# Patient Record
Sex: Male | Born: 1950 | Race: White | Hispanic: No | Marital: Married | Smoking: Former smoker
Health system: Southern US, Community
[De-identification: ages and names within clinical notes are randomized; demographics above are authoritative.]

## PROBLEM LIST (undated history)

## (undated) DIAGNOSIS — I10 Essential (primary) hypertension: Secondary | ICD-10-CM

## (undated) DIAGNOSIS — R7303 Prediabetes: Secondary | ICD-10-CM

## (undated) DIAGNOSIS — E039 Hypothyroidism, unspecified: Secondary | ICD-10-CM

## (undated) DIAGNOSIS — Z86010 Personal history of colonic polyps: Secondary | ICD-10-CM

## (undated) DIAGNOSIS — G8929 Other chronic pain: Secondary | ICD-10-CM

## (undated) DIAGNOSIS — K648 Other hemorrhoids: Secondary | ICD-10-CM

## (undated) DIAGNOSIS — R011 Cardiac murmur, unspecified: Secondary | ICD-10-CM

## (undated) DIAGNOSIS — Z87442 Personal history of urinary calculi: Secondary | ICD-10-CM

## (undated) DIAGNOSIS — D099 Carcinoma in situ, unspecified: Secondary | ICD-10-CM

## (undated) DIAGNOSIS — M549 Dorsalgia, unspecified: Secondary | ICD-10-CM

## (undated) DIAGNOSIS — C4492 Squamous cell carcinoma of skin, unspecified: Secondary | ICD-10-CM

## (undated) DIAGNOSIS — J302 Other seasonal allergic rhinitis: Secondary | ICD-10-CM

## (undated) DIAGNOSIS — N529 Male erectile dysfunction, unspecified: Secondary | ICD-10-CM

## (undated) DIAGNOSIS — M1990 Unspecified osteoarthritis, unspecified site: Secondary | ICD-10-CM

## (undated) HISTORY — PX: NASAL SEPTUM SURGERY: SHX37

## (undated) HISTORY — DX: Unspecified osteoarthritis, unspecified site: M19.90

## (undated) HISTORY — PX: KNEE ARTHROPLASTY: SHX992

## (undated) HISTORY — DX: Other hemorrhoids: K64.8

## (undated) HISTORY — DX: Other seasonal allergic rhinitis: J30.2

## (undated) HISTORY — PX: HERNIA REPAIR: SHX51

## (undated) HISTORY — DX: Personal history of colonic polyps: Z86.010

## (undated) HISTORY — PX: OTHER SURGICAL HISTORY: SHX169

## (undated) HISTORY — PX: KNEE ARTHROSCOPY: SUR90

## (undated) HISTORY — DX: Hypothyroidism, unspecified: E03.9

## (undated) HISTORY — DX: Male erectile dysfunction, unspecified: N52.9

## (undated) HISTORY — PX: CARPAL TUNNEL RELEASE: SHX101

---
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
## 2003-12-04 ENCOUNTER — Encounter (INDEPENDENT_AMBULATORY_CARE_PROVIDER_SITE_OTHER): Payer: Self-pay | Admitting: *Deleted

## 2003-12-04 ENCOUNTER — Ambulatory Visit (HOSPITAL_COMMUNITY): Admission: RE | Admit: 2003-12-04 | Discharge: 2003-12-04 | Payer: Self-pay | Admitting: Gastroenterology

## 2004-04-02 DIAGNOSIS — C4492 Squamous cell carcinoma of skin, unspecified: Secondary | ICD-10-CM

## 2004-04-02 HISTORY — DX: Squamous cell carcinoma of skin, unspecified: C44.92

## 2008-02-23 ENCOUNTER — Ambulatory Visit (HOSPITAL_BASED_OUTPATIENT_CLINIC_OR_DEPARTMENT_OTHER): Admission: RE | Admit: 2008-02-23 | Discharge: 2008-02-23 | Payer: Self-pay | Admitting: Orthopedic Surgery

## 2008-03-21 ENCOUNTER — Ambulatory Visit (HOSPITAL_BASED_OUTPATIENT_CLINIC_OR_DEPARTMENT_OTHER): Admission: RE | Admit: 2008-03-21 | Discharge: 2008-03-21 | Payer: Self-pay | Admitting: Orthopedic Surgery

## 2008-10-25 ENCOUNTER — Inpatient Hospital Stay (HOSPITAL_COMMUNITY): Admission: RE | Admit: 2008-10-25 | Discharge: 2008-10-28 | Payer: Self-pay | Admitting: Specialist

## 2008-11-13 ENCOUNTER — Encounter: Admission: RE | Admit: 2008-11-13 | Discharge: 2009-01-16 | Payer: Self-pay | Admitting: Specialist

## 2010-01-02 ENCOUNTER — Inpatient Hospital Stay (HOSPITAL_COMMUNITY): Admission: RE | Admit: 2010-01-02 | Discharge: 2010-01-05 | Payer: Self-pay | Admitting: Specialist

## 2010-01-21 ENCOUNTER — Encounter
Admission: RE | Admit: 2010-01-21 | Discharge: 2010-04-16 | Payer: Self-pay | Source: Home / Self Care | Attending: Specialist | Admitting: Specialist

## 2010-07-02 LAB — BASIC METABOLIC PANEL
BUN: 13 mg/dL (ref 6–23)
BUN: 7 mg/dL (ref 6–23)
CO2: 26 mEq/L (ref 19–32)
CO2: 27 mEq/L (ref 19–32)
CO2: 28 mEq/L (ref 19–32)
Calcium: 8.4 mg/dL (ref 8.4–10.5)
Calcium: 8.7 mg/dL (ref 8.4–10.5)
Creatinine, Ser: 0.86 mg/dL (ref 0.4–1.5)
Creatinine, Ser: 1.01 mg/dL (ref 0.4–1.5)
GFR calc Af Amer: >60 mL/min (ref >60)
GFR calc non Af Amer: >60 mL/min (ref >60)
GFR calc non Af Amer: >60 mL/min (ref >60)
GFR calc non Af Amer: >60 mL/min (ref >60)
Glucose, Bld: 147 mg/dL — ABNORMAL HIGH (ref 70–99)
Glucose, Bld: 172 mg/dL — ABNORMAL HIGH (ref 70–99)
Potassium: 4.4 mEq/L (ref 3.5–5.1)
Sodium: 136 mEq/L (ref 135–145)
Sodium: 137 mEq/L (ref 135–145)

## 2010-07-02 LAB — DIFFERENTIAL
Eosinophils Absolute: 0.2 10*3/uL (ref 0.0–0.7)
Eosinophils Relative: 3 % (ref 0–5)
Lymphs Abs: 2.4 10*3/uL (ref 0.7–4.0)
Monocytes Absolute: 0.7 10*3/uL (ref 0.1–1.0)
Monocytes Relative: 12 % (ref 3–12)

## 2010-07-02 LAB — CBC
HCT: 36.8 % — ABNORMAL LOW (ref 39.0–52.0)
Hemoglobin: 12.7 g/dL — ABNORMAL LOW (ref 13.0–17.0)
Hemoglobin: 14.2 g/dL (ref 13.0–17.0)
MCH: 29.9 pg (ref 26.0–34.0)
MCH: 30 pg (ref 26.0–34.0)
MCH: 30.2 pg (ref 26.0–34.0)
MCHC: 34.5 g/dL (ref 30.0–36.0)
MCHC: 34.7 g/dL (ref 30.0–36.0)
MCHC: 34.2 g/dL (ref 30.0–36.0)
Platelets: 192 10*3/uL (ref 150–400)
Platelets: 202 10*3/uL (ref 150–400)
RBC: 3.68 MIL/uL — ABNORMAL LOW (ref 4.22–5.81)
RDW: 13.8 % (ref 11.5–15.5)
RDW: 14 % (ref 11.5–15.5)
RDW: 14 % (ref 11.5–15.5)
WBC: 13.8 10*3/uL — ABNORMAL HIGH (ref 4.0–10.5)
WBC: 6.4 10*3/uL (ref 4.0–10.5)

## 2010-07-02 LAB — COMPREHENSIVE METABOLIC PANEL
ALT: 28 U/L (ref 0–53)
AST: 26 U/L (ref 0–37)
CO2: 27 mEq/L (ref 19–32)
Calcium: 9.2 mg/dL (ref 8.4–10.5)
GFR calc Af Amer: >60 mL/min (ref >60)
Potassium: 4.1 mEq/L (ref 3.5–5.1)
Sodium: 140 mEq/L (ref 135–145)
Total Protein: 7.2 g/dL (ref 6.0–8.3)

## 2010-07-02 LAB — URINALYSIS, ROUTINE W REFLEX MICROSCOPIC
Ketones, ur: NEGATIVE mg/dL
Nitrite: NEGATIVE
pH: 5.5 (ref 5.0–8.0)

## 2010-07-02 LAB — MRSA CULTURE

## 2010-07-02 LAB — CROSSMATCH
ABO/RH(D): O POS
Antibody Screen: NEGATIVE

## 2010-07-02 LAB — SURGICAL PCR SCREEN: Staphylococcus aureus: INVALID — AB

## 2010-07-26 LAB — CBC
HCT: 35.8 % — ABNORMAL LOW (ref 39.0–52.0)
HCT: 37.8 % — ABNORMAL LOW (ref 39.0–52.0)
Hemoglobin: 11.9 g/dL — ABNORMAL LOW (ref 13.0–17.0)
Hemoglobin: 12.6 g/dL — ABNORMAL LOW (ref 13.0–17.0)
Hemoglobin: 15.1 g/dL (ref 13.0–17.0)
MCHC: 33.1 g/dL (ref 30.0–36.0)
MCHC: 33.4 g/dL (ref 30.0–36.0)
MCHC: 33.5 g/dL (ref 30.0–36.0)
MCHC: 33.6 g/dL (ref 30.0–36.0)
MCV: 87.1 fL (ref 78.0–100.0)
MCV: 87.9 fL (ref 78.0–100.0)
Platelets: 207 K/uL (ref 150–400)
RBC: 4.08 MIL/uL — ABNORMAL LOW (ref 4.22–5.81)
RBC: 4.34 MIL/uL (ref 4.22–5.81)
RDW: 13.6 % (ref 11.5–15.5)
RDW: 13.8 % (ref 11.5–15.5)
RDW: 13.8 % (ref 11.5–15.5)
RDW: 14.1 % (ref 11.5–15.5)
WBC: 13.1 K/uL — ABNORMAL HIGH (ref 4.0–10.5)

## 2010-07-26 LAB — BASIC METABOLIC PANEL
CO2: 29 mEq/L (ref 19–32)
Chloride: 101 mEq/L (ref 96–112)
Glucose, Bld: 132 mg/dL — ABNORMAL HIGH (ref 70–99)
Potassium: 4.2 mEq/L (ref 3.5–5.1)
Sodium: 135 mEq/L (ref 135–145)

## 2010-07-26 LAB — URINALYSIS, ROUTINE W REFLEX MICROSCOPIC
Bilirubin Urine: NEGATIVE
Ketones, ur: NEGATIVE mg/dL
Nitrite: NEGATIVE
Urobilinogen, UA: 0.2 mg/dL (ref 0.0–1.0)
pH: 6.5 (ref 5.0–8.0)

## 2010-07-26 LAB — CROSSMATCH
ABO/RH(D): O POS
Antibody Screen: NEGATIVE

## 2010-07-26 LAB — COMPREHENSIVE METABOLIC PANEL
ALT: 25 U/L (ref 0–53)
Calcium: 9.1 mg/dL (ref 8.4–10.5)
Glucose, Bld: 120 mg/dL — ABNORMAL HIGH (ref 70–99)
Sodium: 135 mEq/L (ref 135–145)
Total Protein: 7.3 g/dL (ref 6.0–8.3)

## 2010-07-26 LAB — DIFFERENTIAL
Eosinophils Absolute: 0.1 10*3/uL (ref 0.0–0.7)
Lymphocytes Relative: 36 % (ref 12–46)
Lymphs Abs: 2.3 10*3/uL (ref 0.7–4.0)
Monocytes Relative: 12 % (ref 3–12)
Neutro Abs: 3.2 10*3/uL (ref 1.7–7.7)
Neutrophils Relative %: 50 % (ref 43–77)

## 2010-07-26 LAB — APTT: aPTT: 39 seconds — ABNORMAL HIGH (ref 24–37)

## 2010-07-26 LAB — BASIC METABOLIC PANEL WITH GFR
BUN: 12 mg/dL (ref 6–23)
CO2: 27 meq/L (ref 19–32)
Calcium: 8.3 mg/dL — ABNORMAL LOW (ref 8.4–10.5)
Chloride: 103 meq/L (ref 96–112)
Creatinine, Ser: 0.96 mg/dL (ref 0.4–1.5)
GFR calc non Af Amer: >60 mL/min
Glucose, Bld: 147 mg/dL — ABNORMAL HIGH (ref 70–99)
Potassium: 4.2 meq/L (ref 3.5–5.1)
Sodium: 137 meq/L (ref 135–145)

## 2010-07-26 LAB — PROTIME-INR: INR: 1 (ref 0.00–1.49)

## 2010-09-01 NOTE — Discharge Summary (Signed)
[name], [name] NO.:  [id number]   MEDICAL RECORD NO.:  [id number]          PATIENT TYPE:  INP   LOCATION:  1618                         FACILITY:  [facility]   PHYSICIAN:  [name] [name], M.D.DATE OF BIRTH:  1950/05/15   DATE OF ADMISSION:  10/25/2008  DATE OF DISCHARGE:  10/28/2008                               DISCHARGE SUMMARY   DISPOSITION:  To home.   ADMISSION DIAGNOSES:  1. 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 Thyroid disease.  3. Seasonal allergies.  4. Erectile dysfunction.   DISCHARGE DIAGNOSES:  1. Right total knee arthroplasty.  2. Routine postoperative blood loss,  asymptomatic.  3. History of thyroid disease.  4. History of seasonal allergies.  5. History of erectile dysfunction.   HISTORY OF PRESENT ILLNESS:  The patient is a 60 year old gentleman with  bilateral knee pains.  Evaluation showed significant end-stage arthritic  changes, bone on bone medial compartments.  The patient has failed  conservative treatment.  The patient has elected to proceed with total  knee arthroplasty.   ALLERGIES:  No known drug allergies.   MEDICATIONS ON ADMISSION:  1. Fexofenadine.  2. Aspirin.  3. Vitamin B complex.  4. Vitamin D.  5. Osteo-BiFlex.  6. Cialis p.r.n.   SURGICAL PROCEDURE:  On October 25, 2008, the patient was taken to the  operating room by Dr. Valma [name], assisted by [name] Alar, PA-C under  spinal anesthesia, the patient underwent a right total knee arthroplasty  with a rotating platform system.  The patient tolerated the procedure  well.  There were no complications.  The patient was transferred to the  recovery room and then to the orthopedic floor in good condition.  The  patient had the following components implanted:  A sized 4 right femoral  component, a size 5 keel tibial tray, a size 4, 15 mm thickness  polyethylene bearing, a size  38, 3 peg patella.  All components were  implanted with polymethylmethacrylate.   CONSULTATIONS:  The following routine consultations were requested:  1. Physical therapy.  2. Case management.  3. Pharmacy.   HOSPITAL COURSE:  On October 25, 2008 the patient was admitted to [facility] under the care of Dr. Valma [name].  The patient was  taken to the operating room where a right total knee arthroplasty was  performed without any complications.  The patient tolerated the  procedure well without any issues.  The patient was transferred to the  recovery room and then to the orthopedic floor to follow with total knee  protocol on Lovenox for deep venous thrombosis prophylaxis and  intravenous antibiotics.  The patient then incurred a 3 day  postoperative course and the patient did very well.  The patient's vital  signs remained stable.  He remained afebrile.  The patient's wound  benign for any signs of infection.  His leg remained neuromotor  vascularly intact.  The patient worked well with physical therapy.  On  the date of discharge he  was able to get up to 60 degrees with the CPM.  The patient was ambulating 200 to 250 feet daily with physical therapy.  The patient did develop some routine postoperative blood loss for which  he was able to tolerate it well without any problems with orthostatic  hypotension, light headedness, dizziness or any vital sign changes.  His  hemoglobin did drop to 10.8.  Otherwise, the patient did very well.  He  was medically and orthopedically stable and ready for discharge to home  with home health physical therapy and routine postoperative followup  with Lovenox DVT prophylaxis.   LABORATORY DATA:  CBC on admission found white blood cells to be 6.5,  hemoglobin 15.1, hematocrit 44.9, platelet count 222,000.  At discharge  from hospital, the patient's white blood cells were 12.5, which is  routine postoperative reaction, hemoglobin was 10.8,  hematocrit 32.7,  platelet count 179,000.  Admission chemistries found sodium of 135,  potassium 4.3, glucose 120, BUN 13, creatinine 1.01.  On postoperative  day sodium was 135, potassium was 4.2, BUN was 7, creatinine 0.87 with a  glucose of 132.   DISCHARGE INSTRUCTIONS:   DIET:  No restrictions.   ACTIVITY:  The patient is to increase his activity on a daily basis  slowly with use of a walker and instructions with physical therapy.  Home health physical therapy to work on ambulation, range of motion and  strengthening.  CPM 0 to 60 degrees for 6 hours a day, may slowly  increase range of motion to 90 degrees a day.   WOUND CARE:  The patient was to keep his wound clean and dry and to  change the dressing daily.   FOLLOWUP:  The patient needs a followup appointment with Dr. Thomasena [name] in  2 weeks from the date of surgery.  The patient was to call [phone] for  that followup appointment.   DISCHARGE MEDICATIONS:  1. Percocet 5 mg, one or two tablets every 4 to 6 hours for pain if      needed,.  2. Robaxin 500 mg one tablet every 6 hours for muscle spasms if      needed.  3. Lovenox 30 mg injection once every 12 hours until gone, for a total      of 3 weeks.  4. Iron 1 tablet a day until gone.  5. Colace 100 mg one tablet twice a day while on narcotics, may get      OTC.  6. MiraLax 17 grams for constipation, if needed, may get OTC.  7. Levothyroxine 0.05 mg once a day.  8. Fexofenadine 180 mg a day.  9. Vitamin D one tablet a day.  10.Osteo-BiFlex 2 tablets a day.  11.Aspirin 81 mg a day.  12.Vitamin B12 one tablet a day.   CONDITION ON DISCHARGE:  The patient's condition on discharge to home is  listed as improved and good.      [name] [name], P.A.    ______________________________  [name] [name], M.D.    RWK/MEDQ  D:  10/28/2008  T:  10/28/2008  Job:  [id number]   cc:   [name] [name], M.D.  Fax: [phone]

## 2010-09-01 NOTE — Op Note (Signed)
NAMECLETE, [name] [name]                ACCOUNT NO.:  [id number]   MEDICAL RECORD NO.:  [id number]          PATIENT TYPE:  AMB   LOCATION:  DSC                          FACILITY:  [facility]   PHYSICIAN:  Katy Fitch. Sypher, M.D. DATE OF BIRTH:  12/13/1950   DATE OF PROCEDURE:  03/21/2008  DATE OF DISCHARGE:                               OPERATIVE REPORT   PREOPERATIVE DIAGNOSIS:  Chronic entrapped neuropathy, left median nerve  or carpal tunnel.   POSTOPERATIVE DIAGNOSIS:  Chronic entrapped neuropathy, left median  nerve or carpal tunnel.   OPERATIONS:  Release of left transcarpal ligament.   OPERATING SURGEON:  Katy Fitch. Sypher, MD   ASSISTANT:  Marveen Reeks Dasnoit, PA-C   ANESTHESIA:  General by LMA.   SUPERVISING ANESTHESIOLOGIST:  Guadalupe Maple, MD   INDICATIONS:  [name] [name] is a 60 year old gentleman who referred for  evaluation and management of bilateral hand numbness and a right long  trigger finger.   He is status post release of his right transcarpal ligament and release  of his right long finger A1 pulley of satisfactory results.  He now  presents for release of his left transcarpal ligament.   Preoperatively, he was examined in the holding area to assure that he  did not have other areas of stenosing tenosynovitis or predicaments  beside the left carpal tunnel syndrome.   After informed consent, he was brought to the operating at this time and  anticipating release of his left transverse carpal ligament.   PROCEDURE:  [name] [name] was brought to the operating room and placed in  supine position on the operating table.   Following the induction of general anesthesia by LMA technique, the left  arm was prepped with Betadine soap solution and sterilely draped.  A  pneumatic tourniquet was applied to the proximal left brachium.   Following exsanguination of the left arm with an Esmarch bandage, the  arterial tourniquet was inflated to 220 mmHg.  The procedure  commenced  with a short incision in the line of the ring finger of the palm.  Subcutaneous tissues were carefully divided revealing the palmar fascia.  This was split longitudinally to reveal a common sensory branch of the  median nerve.  These were followed back to transcarpal ligament which  was gently isolated from the median nerve.   The ligament was then released along its ulnar border extending into the  distal forearm.   This widely opened carpal canal.   No masses or other predicaments were noted   Bleeding points along the margin of the released ligament were  electrocauterized with bipolar current followed by repair of the skin  with intradermal 3-0 Prolene suture.  A compressive dressing was applied  with volar plaster splint maintaining the wrist in 5 degrees of  dorsiflexion.   For aftercare, [name] [name] was provided a prescription for Percocet 5 mg  one p.o. q.4-6 h. p.r.n. pain.  He was provided 20 tablets without  refill.      Katy Fitch Sypher, M.D.  Electronically Signed     RVS/MEDQ  D:  03/21/2008  T:  03/21/2008  Job:  [id number]

## 2010-09-01 NOTE — Op Note (Signed)
NAMETYWONE, BEMBENEK                ACCOUNT NO.:  [id number]   MEDICAL RECORD NO.:  [id number]          PATIENT TYPE:  AMB   LOCATION:  DSC                          FACILITY:  [facility]   PHYSICIAN:  Katy Fitch. Sypher, M.D. DATE OF BIRTH:  09-04-1950   DATE OF PROCEDURE:  02/23/2008  DATE OF DISCHARGE:                               OPERATIVE REPORT   PREOPERATIVE DIAGNOSES:  1. Chronic entrapped neuropathy, median nerve right carpal tunnel.  2. Chronic stenosing tenosynovitis, right long finger A1 pulley.  3. Stiff right long finger proximal interphalangeal joint with 20-      degree flexion contracture due to chronic stenosing tenosynovitis.   POSTOPERATIVE DIAGNOSES:  1. Chronic entrapped neuropathy, median nerve right carpal tunnel.  2. Chronic stenosing tenosynovitis, right long finger A1 pulley.  3. Stiff right long finger proximal interphalangeal joint with 20-      degree flexion contracture due to chronic stenosing tenosynovitis.   OPERATION:  1. Release of right transcarpal ligament.  2. Release of right long finger stenosing tenosynovitis by incision of      A1 pulley and tenosynovectomy of superficialis profundus tendons.  3. Injection of right long finger proximal interphalangeal joint      capsule.   OPERATING SURGEON:  Katy Fitch. Sypher, MD   ASSISTANT:  Marveen Reeks Dasnoit, PA   ANESTHESIA:  General by LMA.   SUPERVISING ANESTHESIOLOGIST:  Kaylyn Layer. Michelle Piper, MD   INDICATIONS:  Danta Baumgardner is a 60 year old gentleman referred through  the courtesy of the [employer] for evaluation of hand  numbness and a locking right long trigger finger.  Clinical examination  confirmed right carpal tunnel syndrome with positive electrodiagnostic  studies and chronic locking stenosing tenosynovitis of the right long  finger.  Due to a failed respond to nonoperative measures, he was  brought to the operating at this time for release of his right  transcarpal ligament,  his right long finger A1 pulley, and due to the  stiffness of the PIP joint with a flexion contracture, we recommended  injection of the PIP joint capsule with Depo-Medrol and lidocaine to  facilitate recovery of full motion after A1 pulley release.  After  informed consent, Mr. Bulkley was brought to the operating at this time.   DESCRIPTION OF PROCEDURE:  Aadon Gorelik was brought to the operating  room and placed in the supine position upon the operating table.   Following induction of general anesthesia by LMA technique, the right  arm was prepped with Betadine soap solution and sterilely draped.  A  pneumatic tourniquet was applied to the proximal brachium.  Following  exsanguination of the right arm with an Esmarch bandage, arterial  tourniquet was inflated to 220 mmHg.  The procedure was commenced with a  short incision in the line of the ring finger in the palm.  Subcutaneous  tissues were carefully divided revealing the palmar fascia.  This split  longitudinally to reveal the common sensory branches of the median  nerve.  These were followed back to the transverse carpal ligament,  which was gently isolated to the  median nerve.  A [place]field 4 elevator  was used to clear pathway to the carpal canal, separate the median nerve  from the tenosynovium and transverse carpal ligament.   The ligament was released along its ulnar border extending into the  distal forearm.  This widely opened the carpal canal.  No masses or  other predicaments were noted.  There was fibrotic tenosynovium in the  ulnar bursa.   The right long finger stenosing tenosynovitis was then addressed.  A  short oblique incision was fashioned in the distal palmar crease.  Subcutaneous tissues were carefully divided releasing the palmar fascia  and pre-tendinous fibers.  The A1 pulley was identified and a large  bulging cuff of fibrotic tenosynovium was noted proximal to the A1  pulley.   The fibrotic cuff was  resected with scissors and dissection with a fine  rongeur.  The A1 pulley was incised and tendons delivered.  There was a  circumferential cuff a very fibrotic tenosynovium on the superficialis  tendon that was meticulously removed with scissors and rongeur  dissection.  The profundus tendon was less involved.  After  tenosynovectomy, the flexor tendons full passive flexion and extension  of the MP and IP joints were achieved.  However, due to the swelling of  the PIP joint capsule due to chronic triggering, we proceeded with  injection of the PIP joint from dorsal approach with a mixture of 50:50  Depo-Medrol 40 mg/mL and 1 mL of 2% plain lidocaine.  A total volume of  8/10th of the mL was placed within the PIP joint capsule.   Mr. Schlabach wounds were then repaired with intradermal through Prolene  followed by dressing with Xeroflo, sterile gauze, and a compressive hand  dressing.  A volar plaster splint was applied maintaining the wrist in 5  degrees dorsiflexion.   There were no apparent complications.   For aftercare, Ms. Kazmierczak was provided prescriptions for Vicodin 5 mg 1  p.o. q.4-6 h. p.r.n. pain.  We will see him back in followup in the  office in 7-10 days.  At that time, we will initiate an exercise program  and consider suture removal.      Katy Fitch. Sypher, M.D.  Electronically Signed     RVS/MEDQ  D:  02/23/2008  T:  02/23/2008  Job:  [id number]   cc:   Evelena Peat, M.D.

## 2010-09-01 NOTE — Op Note (Signed)
[name], [name] NO.:  [id number]   MEDICAL RECORD NO.:  [id number]          PATIENT TYPE:  INP   LOCATION:  0002                         FACILITY:  [facility]   PHYSICIAN:  Erasmo Leventhal, M.D.DATE OF BIRTH:  06-15-1950   DATE OF PROCEDURE:  10/25/2008  DATE OF DISCHARGE:                               OPERATIVE REPORT   PREOPERATIVE DIAGNOSES:  Right knee end-stage osteoarthritis.   POSTOPERATIVE DIAGNOSES:  Right knee end-stage osteoarthritis.   PROCEDURE:  Right total knee arthroplasty.   SURGEON:  Valma Cava, M.D.   ASSISTANT:  Oneida Alar, PA-C   ANESTHESIA:  Monitored anesthesia care with spinal.   ESTIMATED BLOOD LOSS:  Less than 100 mL.   DRAINS:  One Hemovac.   COMPLICATIONS:  None.   TOURNIQUET TIME:  One hour and 40 minutes at 300 mmHg.   OPERATIVE IMPLANTS:  [employer] size 4 femur, size  5 tibia, 38-mm patella with a size 4 15-mm posterior stabilized rotating  platform tibial insert, all cemented.   OPERATIVE DETAILS:  Patient was counseled in the holding area, correct  side was identified.  Taken to the OR where spinal anesthetic was  administered.  Intravenous antibiotics were given.  Foley catheter was  placed via sterile technique by the OR circulating nurse.  All  extremities were well-padded and bumped.  Right knee showed a 5-degree  flexion contracture and flexed to 110 degrees.  He was in noticeable  varus.  He was elevated, prepped with DuraPrep in sterile fashion,  exsanguinated with an Esmarch and tourniquet inflated to 300 mmHg.  A  straight midline incision was made through skin and subcutaneous tissue  and all soft-tissue flaps were developed.  Medial parapatellar  arthrotomy was performed.  Patella was retracted out of the way.  Large  osteophytes, bone-against-bone contact, all compartments.  Osteophytes  were removed from intercondylar notch.  The ACL was absent, PCL was  removed.  A start  hole was made in the distal femur.  Canal was  irrigated until the effluent was clear.  Intramedullary rod was gently  placed.  We chose a 5-degree valgus cut, took a 10-mm cut off the distal  femur.  The distal femur was found be a size #4.  Rotational marks were  set, balanced for a size 4 and femur was cut.  Medial and lateral  menisci removed under direct visualization.  Geniculate vessels were  coagulated.   Posterior tibia had quite a posterior slope to it.  This was noted on  preoperative x-rays.  Extramedullary alignment was adjusted for a 2-mm  cut off the defect on the medial side.  This was done, circumferentially  protected from the surrounding capsule and trial ligaments.  Posterior  vascular structures were protected throughout the entire case.  Proximal  tibia was found be a size #5.  With flexion/extension blocks of 10-12.5  we were well-balanced.  Knee was then flexed.  Tibia was exposed.  Rotation cover was set.  Reamer punch was performed.  Femoral box cut  was now performed.  With this there  was found to be a size 5 tibia, size  4 femur, 12.5 rotating platform tibial insert, well-balanced in flexion,  extension, and stable.   Patella was found be a size 38.  Both bones were resected.  Block hole  was made.  Patella tracked anatomically.  All trials were removed.  Knee  was irrigated with pulsatile lavage.  Utilizing modern cement technique,  all components were cemented into place, size 4 femur, size 5 tibia, 38  patella.  After cement had cured and reinforced, we were well-balanced  with a 15 trial insert.  The final component was removed.  Excess cement  was removed.  Knee was irrigated with pulsatile lavage.  A final size 4  15-mm rotating platform posterior stabilized rotating tibial insert was  implanted.  We now had a well-balanced, well-aligned knee.   A drain was placed.  Knee was again irrigated.  Sequential closure  layers were done.  The knee was closed  in flexion with Vicryl, subcu  with Vicryl, skin with a subcuticular Monocryl suture.  Sterile dressing  was applied, drain was hooked to suction.  Tourniquet deflated.  He had  normal circulation in foot and ankle at end of the case.  Sponge and  needle count correct.  No complications or problems.   Help with surgical technique, decision making, Mr. Oneida Alar, PA-C,  assistance was needed throughout the entire case.           ______________________________  Erasmo Leventhal, M.D.     RAC/MEDQ  D:  10/25/2008  T:  10/25/2008  Job:  [id number]   cc:   [facility]

## 2010-09-04 NOTE — H&P (Signed)
[name], [name] NO.:  [id number]   MEDICAL RECORD NO.:  [id number]          PATIENT TYPE:  INP   LOCATION:                               FACILITY:  [facility]   PHYSICIAN:  [name] [name], M.D.DATE OF BIRTH:  1950/12/21   DATE OF ADMISSION:  10/23/2008  DATE OF DISCHARGE:                              HISTORY & PHYSICAL   CHIEF COMPLAINT:  Severe bilateral knee pain.   HISTORY OF PRESENT ILLNESS:  The patient is a 60 year old gentleman with  bilateral knee pain.  His evaluation shows he has significant arthritic  changes with bone-on-bone medial compartments.  He has failed  conservative treatment with cortisone and lubricating injections.  He  has also tried bracing without any improvement.  The patient would like  to proceed with a right total knee arthroplasty possibly to be followed  by a left total knee arthroplasty.   ALLERGIES:  No known drug allergies.   CURRENT MEDICATIONS:  1. Levothyroxine.  2. Fexofenadine.  3. Aspirin.  4. Vitamin B Complex.  5. Vitamin D.  6. Osteo Bi-Flex.  7. Cialis.   PAST MEDICAL HISTORY:  1. Includes thyroid disease.  2. Seasonal allergies.  3. Erectile dysfunction.   REVIEW OF SYSTEMS:  Negative for any neurologic issues.  PULMONARY:  Positive for just year round seasonal type allergies.  CARDIOVASCULAR:  Unremarkable for any chest pain, irregular heart rhythms.  He has never  had to have a special cardiac study.  GI:  He has reflux disease.  He  uses occasional over-the-counter Prilosec.  He denies any liver or  pancreatic disease.  GU:  He has had a kidney stone back in 1988 which  was treated with lithotripsy.  No other current issues.  ENDOCRINE:  Positive for thyroid disease only.   PAST SURGICAL HISTORY:  Includes bilat earl carpal tunnel release,  bilateral knee arthroscopies, meniscal debridement.   FAMILY MEDICAL HISTORY:  Father is deceased from complications of an MI,  diabetes.  He had  bilateral BKAs.  Mother is deceased from possible  cervical cancer.   SOCIAL HISTORY:  Patient is married, lives with his wife in a one story  home.  He smoked 28 years previous.  He has occasional alcohol, beer or  beverage.   PHYSICAL EXAMINATION:  VITALS:  Height 5 feet 9 inches, weight 195.  Blood pressure 148/78, pulse 70, regular, respirations 12.  Patient is  afebrile.  GENERAL:  This is a healthy appearing well-developed gentleman  conscious, alert and appropriate.  He is a pretty good historian.  HEENT:  Head was normocephalic.  Pupils equal, round and reactive.  Gross hearing is intact.  NECK:  Supple, no palpable lymphadenopathy.  CHEST:  Lung sounds were clear and equal bilaterally with no wheezes,  rales or rhonchi.  HEART:  Regular rate and rhythm.  No murmurs, rubs, gallops.  ABDOMEN:  Soft, bowel sounds present.  EXTREMITIES:  Upper extremities had excellent range of motion without  any difficulty.  LOWER EXTREMITIES:  He had good range of motion in both hips.  Both  knees had  varus deformities.  He was able to fully extend both knees.  He could flex them back to 120 degrees.  He did have bone-on-bone medial  compartment with crepitus.  BREAST/RECTAL/GU:  Exams were deferred.  PERIPHERAL VASCULAR:  Carotid pulses 2+ without bruits, radial pulses  2+, posterior tibial pulses were1+.  He had no lower extremity edema or  venous stasis changes.   IMPRESSION:  1. End-stage osteoarthritis bilateral knees, bone-on-bone medial      compartments right more symptomatic than left.  2. Thyroid disease.  3. Seasonal allergies.  4. Erectile dysfunction.   PLAN:  The patient will undergo all routine laboratories and tests prior  to having a right total knee arthroplasty by Dr. Thomasena [name] at [facility] on October 11, 2008.  Patient has been cleared by his primary care  physician, Dr. Marjory [name], for his upcoming surgical procedure.      Jamelle Rushing, P.A.     ______________________________  [name] [name], M.D.    RWK/MEDQ  D:  09/23/2008  T:  09/23/2008  Job:  [id number]   cc:   [name] [name], M.D.  Fax: [phone]

## 2010-09-04 NOTE — Op Note (Signed)
NAME:  [name] [name], [name] [name]                          ACCOUNT NO.:  [id number]   MEDICAL RECORD NO.:  [id number]                   PATIENT TYPE:  AMB   LOCATION:  ENDO                                 FACILITY:  [facility]   PHYSICIAN:  Anselmo Rod, M.D.               DATE OF BIRTH:  22-Apr-1950   DATE OF PROCEDURE:  12/04/2003  DATE OF DISCHARGE:                                 OPERATIVE REPORT   PROCEDURE PERFORMED:  Colonoscopy with snare polypectomy times one.   ENDOSCOPIST:  Charna Elizabeth, M.D.   INSTRUMENT USED:  Olympus video colonoscope.   INDICATIONS FOR PROCEDURE:  Guaiac positive stools in a 60 year old white  male.  Undergoing screening colonoscopy to rule out colonic polyps, masses,  etc.   PREPROCEDURE PREPARATION:  Informed consent was procured from the patient.  The patient was fasted for eight hours prior to the procedure and prepped  with a bottle of magnesium citrate and a gallon of GoLYTELY the night prior  to the procedure.   PREPROCEDURE PHYSICAL:  The patient had stable vital signs.  Neck supple.  Chest clear to auscultation.  S1 and S2 regular.  Abdomen soft with normal  bowel sounds.   DESCRIPTION OF PROCEDURE:  The patient was placed in left lateral decubitus  position and sedated with 60 mg of Demerol and 6 mg of Versed in slow  incremental doses.  Once the patient was adequately sedated and maintained  on low flow oxygen and continuous cardiac monitoring, the Olympus video  colonoscope was advanced from the rectum to the cecum.  The appendicular  orifice and ileocecal valve were clearly visualized and photographed.  The  terminal ileum appeared normal.  There was some residual stool in the colon  and multiple washes were done.  A small sessile polyp was snared (part) from  65 cm.  Retroflexion in the rectum revealed small internal hemorrhoids.  The  rest of the colonic mucosa appeared healthy and without lesions.  There was  no diverticulosis.  The patient  tolerated the procedure without  complications.   IMPRESSION:  1. Small sessile polyp snared from 65 cm.  2. Small internal hemorrhoids.  3. Otherwise normal colon up to the terminal ileum.   RECOMMENDATIONS:  1. Await pathology results.  2. Repeat colorectal cancer screening depending on pathology results.  3. Outpatient followup for repeat guaiac testing.  Further recommendations     will be made thereafter.                                               Anselmo Rod, M.D.    JNM/MEDQ  D:  12/04/2003  T:  12/04/2003  Job:  [id number]   cc:   Marjory Lies, M.D.  [address]  [place]  [place]  [address]  Fax: [phone]

## 2011-01-19 LAB — POCT HEMOGLOBIN-HEMACUE: Hemoglobin: 14.8

## 2012-06-15 ENCOUNTER — Other Ambulatory Visit: Payer: Self-pay | Admitting: Family Medicine

## 2012-06-18 ENCOUNTER — Ambulatory Visit
Admission: RE | Admit: 2012-06-18 | Discharge: 2012-06-18 | Disposition: A | Discharge status: Home / Self Care | Payer: Commercial Managed Care - PPO | Source: Ambulatory Visit | Attending: Family Medicine | Admitting: Family Medicine

## 2012-06-18 DIAGNOSIS — M545 Low back pain: Secondary | ICD-10-CM

## 2012-11-28 ENCOUNTER — Other Ambulatory Visit: Payer: Self-pay | Admitting: Physician Assistant

## 2014-01-22 ENCOUNTER — Encounter: Payer: Self-pay | Admitting: Cardiology

## 2014-01-22 ENCOUNTER — Ambulatory Visit (HOSPITAL_COMMUNITY)
Admission: RE | Admit: 2014-01-22 | Discharge: 2014-01-22 | Disposition: A | Discharge status: Home / Self Care | Payer: Worker's Compensation | Source: Ambulatory Visit | Attending: General Surgery | Admitting: General Surgery

## 2014-01-22 ENCOUNTER — Ambulatory Visit (INDEPENDENT_AMBULATORY_CARE_PROVIDER_SITE_OTHER): Payer: Worker's Compensation | Admitting: Cardiology

## 2014-01-22 VITALS — BP 146/84 | HR 71 | Ht 68.0 in | Wt 201.0 lb

## 2014-01-22 DIAGNOSIS — R011 Cardiac murmur, unspecified: Secondary | ICD-10-CM

## 2014-01-22 DIAGNOSIS — R030 Elevated blood-pressure reading, without diagnosis of hypertension: Secondary | ICD-10-CM

## 2014-01-22 DIAGNOSIS — Z01810 Encounter for preprocedural cardiovascular examination: Secondary | ICD-10-CM

## 2014-01-22 DIAGNOSIS — I081 Rheumatic disorders of both mitral and tricuspid valves: Secondary | ICD-10-CM | POA: Insufficient documentation

## 2014-01-22 DIAGNOSIS — IMO0001 Reserved for inherently not codable concepts without codable children: Secondary | ICD-10-CM | POA: Insufficient documentation

## 2014-01-22 DIAGNOSIS — I369 Nonrheumatic tricuspid valve disorder, unspecified: Secondary | ICD-10-CM

## 2014-01-22 NOTE — Patient Instructions (Addendum)
Your physician recommends that you schedule a follow-up appointment in: to be determined after test    Your physician has requested that you have an echocardiogram at 3 PM TODAY. Echocardiography is a painless test that uses sound waves to create images of your heart. It provides your doctor with information about the size and shape of your heart and how well your heart's chambers and valves are working. This procedure takes approximately one hour. There are no restrictions for this procedure.      Thank you for choosing [facility] !

## 2014-01-22 NOTE — Assessment & Plan Note (Signed)
Echocardiogram to be obtained.

## 2014-01-22 NOTE — Progress Notes (Signed)
  Echocardiogram 2D Echocardiogram has been performed.  [place], [place] 01/22/2014, 4:03 PM

## 2014-01-22 NOTE — Assessment & Plan Note (Signed)
Noted today, no standing history of hypertension. Keep followup with Dr. Tollie Pizza.

## 2014-01-22 NOTE — Assessment & Plan Note (Signed)
Patient being considered for elective umbilical hernia repair with Dr. Romona Curls. He describes routine activities easily exceeding 4 METs without any significant shortness of breath or angina symptoms. He reports no known history of obstructive CAD, diabetes mellitus, myocardial infarction, cardiomyopathy, or cardiac arrhythmia. ECG is normal today. He does have a heart murmur on examination, suspect overall benign, could be sclerotic aortic valve. At this point no clear indication for stress testing, expect that he can proceed with planned surgery at overall low perioperative cardiac risk. We will obtain an echocardiogram to assess cardiac murmur further, although doubt that he will have major limiting valvular issues at this time based on examination.

## 2014-01-22 NOTE — Progress Notes (Signed)
Clinical Summary Mr. Levit is a 63 y.o.male referred for preoperative cardiac evaluation by Dr. Romona Curls prior to elective umbilical hernia repair. 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 He states he does this without any limiting shortness of breath or chest pain. He reports no known history of cardiovascular disease or heart rhythm abnormalities.  Primary care is with Dr. Tollie Pizza. Patient does not endorse any history of hypertension or major lipid abnormalities. States he was told recently that he has a heart murmur. Also that his heart rate was "irregular " He does not experience any palpitations, no dizziness or syncope.  ECG in the office today is normal showing sinus rhythm. There is no old tracing for comparison.  No recent lab work to review.  No Known Allergies  Current Outpatient Prescriptions  Medication Sig Dispense Refill  . aspirin 81 MG tablet Take 81 mg by mouth daily.      . cetirizine (ZYRTEC) 10 MG tablet Take 10 mg by mouth daily.      Marland Kitchen HYDROcodone-acetaminophen (NORCO/VICODIN) 5-325 MG per tablet Take 1 tablet by mouth every 6 (six) hours as needed for moderate pain.      Marland Kitchen levothyroxine (SYNTHROID, LEVOTHROID) 50 MCG tablet Take 50 mcg by mouth daily before breakfast.      . methocarbamol (ROBAXIN) 500 MG tablet Take 500 mg by mouth every 8 (eight) hours as needed for muscle spasms.      . Multiple Vitamin (MULTIVITAMIN) tablet Take 1 tablet by mouth daily.       No current facility-administered medications for this visit.    Past Medical History  Diagnosis Date  . Hypothyroidism   . Seasonal allergies   . Erectile dysfunction   . Osteoarthritis   . History of colonic polyps   . Internal hemorrhoids     Past Surgical History  Procedure Laterality Date  . Carpal tunnel release Bilateral   . Knee arthroscopy Bilateral   . Knee arthroplasty Right     Family History    Problem Relation Age of Onset  . Heart disease Father   . Diabetes Mellitus II Father   . Cervical cancer Mother     Social History Mr. Meddaugh reports that he quit smoking about 33 years ago. His smoking use included Cigarettes. He started smoking about 55 years ago. He smoked 1.00 pack per day. He has quit using smokeless tobacco. His smokeless tobacco use included Chew. Mr. Chaddock reports that he drinks alcohol.  Review of Systems Reports chronic back pain, states that he has had some lumbar injections for pain management. Other systems reviewed and negative.  Physical Examination Filed Vitals:   01/22/14 0923  BP: 146/84  Pulse: 71   Filed Weights   01/22/14 0923  Weight: 201 lb (91.173 kg)   The patient appears comfortable at rest. HEENT: Conjunctiva and lids normal, oropharynx clear. Neck: Supple, no elevated JVP or carotid bruits, no thyromegaly. Lungs: Clear to auscultation, nonlabored breathing at rest. Cardiac: Regular rate and rhythm, no S3, 2/6 systolic murmur predominantly at base with radiation toward apex, no pericardial rub. Abdomen: Soft, nontender, bowel sounds present, no guarding or rebound. Extremities: No pitting edema, distal pulses 2+. Skin: Warm and dry. Musculoskeletal: No kyphosis. Neuropsychiatric: Alert and oriented x3, affect grossly appropriate.   Problem List and Plan   Preoperative cardiovascular examination Patient being considered for elective umbilical hernia repair with Dr. Romona Curls. He describes  routine activities easily exceeding 4 METs without any significant shortness of breath or angina symptoms. He reports no known history of obstructive CAD, diabetes mellitus, myocardial infarction, cardiomyopathy, or cardiac arrhythmia. ECG is normal today. He does have a heart murmur on examination, suspect overall benign, could be sclerotic aortic valve. At this point no clear indication for stress testing, expect that he can proceed with planned  surgery at overall low perioperative cardiac risk. We will obtain an echocardiogram to assess cardiac murmur further, although doubt that he will have major limiting valvular issues at this time based on examination.  Murmur, cardiac Echocardiogram to be obtained.  Elevated blood pressure Noted today, no standing history of hypertension. Keep followup with Dr. Tollie Pizza.    Satira Sark, M.D., F.A.C.C.

## 2014-01-23 ENCOUNTER — Telehealth: Payer: Self-pay | Admitting: *Deleted

## 2014-01-23 NOTE — Telephone Encounter (Signed)
Pt notified, will forward note to Dr. Romona Curls.

## 2014-01-23 NOTE — Telephone Encounter (Signed)
Message copied by Desma Mcgregor on Wed Jan 23, 2014  8:27 AM ------      Message from: MCDOWELL, Aloha Gell      Created: Wed Jan 23, 2014  7:44 AM       Reviewed report. Normal LV systolic function with mild diastolic dysfunction. No major valvular abnormalities, the aortic valve is mildly calcified. He is cleared to proceed with elective umbilical hernia repair at relatively low perioperative cardia risk, no further cardiac testing at this time. ------

## 2014-02-04 ENCOUNTER — Encounter (HOSPITAL_COMMUNITY): Payer: Self-pay | Admitting: Pharmacy Technician

## 2014-02-05 NOTE — Patient Instructions (Signed)
[name] [name]  02/05/2014   Your procedure is scheduled on:  02/12/2014  Report to [facility] at 6:15 AM.  Call this number if you have problems the morning of surgery: [phone]   Remember:   Do not eat food or drink liquids after midnight.   Take these medicines the morning of surgery with A SIP OF WATER: Zyrtec, Hydrocodone, Synthroid, Robaxin   Do not wear jewelry, make-up or nail polish.  Do not wear lotions, powders, or perfumes. You may wear deodorant.  Do not shave 48 hours prior to surgery. Men may shave face and neck.  Do not bring valuables to the hospital.  [facility] is not responsible for any belongings or valuables.               Contacts, dentures or bridgework may not be worn into surgery.  Leave suitcase in the car. After surgery it may be brought to your room.  For patients admitted to the hospital, discharge time is determined by your treatment team.               Patients discharged the day of surgery will not be allowed to drive home.  Name and phone number of your driver:   Special Instructions: Shower using CHG 2 nights before surgery and the night before surgery.  If you shower the day of surgery use CHG.  Use special wash - you have one bottle of CHG for all showers.  You should use approximately 1/3 of the bottle for each shower.   Please read over the following fact sheets that you were given: Surgical Site Infection Prevention and Anesthesia Post-op Instructions   PATIENT INSTRUCTIONS POST-ANESTHESIA  IMMEDIATELY FOLLOWING SURGERY:  Do not drive or operate machinery for the first twenty four hours after surgery.  Do not make any important decisions for twenty four hours after surgery or while taking narcotic pain medications or sedatives.  If you develop intractable nausea and vomiting or a severe headache please notify your doctor immediately.  FOLLOW-UP:  Please make an appointment with your surgeon as instructed. You do not need to follow up with  anesthesia unless specifically instructed to do so.  WOUND CARE INSTRUCTIONS (if applicable):  Keep a dry clean dressing on the anesthesia/puncture wound site if there is drainage.  Once the wound has quit draining you may leave it open to air.  Generally you should leave the bandage intact for twenty four hours unless there is drainage.  If the epidural site drains for more than 36-48 hours please call the anesthesia department.  QUESTIONS?:  Please feel free to call your physician or the hospital operator if you have any questions, and they will be happy to assist you.      Hernia A hernia occurs when an internal organ pushes out through a weak spot in the abdominal wall. Hernias most commonly occur in the groin and around the navel. Hernias often can be pushed back into place (reduced). Most hernias tend to get worse over time. Some abdominal hernias can get stuck in the opening (irreducible or incarcerated hernia) and cannot be reduced. An irreducible abdominal hernia which is tightly squeezed into the opening is at risk for impaired blood supply (strangulated hernia). A strangulated hernia is a medical emergency. Because of the risk for an irreducible or strangulated hernia, surgery may be recommended to repair a hernia. CAUSES   Heavy lifting.  Prolonged coughing.  Straining to have a bowel movement.  A cut (  incision) made during an abdominal surgery. HOME CARE INSTRUCTIONS   Bed rest is not required. You may continue your normal activities.  Avoid lifting more than 10 pounds (4.5 kg) or straining.  Cough gently. If you are a smoker it is best to stop. Even the best hernia repair can break down with the continual strain of coughing. Even if you do not have your hernia repaired, a cough will continue to aggravate the problem.  Do not wear anything tight over your hernia. Do not try to keep it in with an outside bandage or truss. These can damage abdominal contents if they are trapped  within the hernia sac.  Eat a normal diet.  Avoid constipation. Straining over long periods of time will increase hernia size and encourage breakdown of repairs. If you cannot do this with diet alone, stool softeners may be used. SEEK IMMEDIATE MEDICAL CARE IF:   You have a fever.  You develop increasing abdominal pain.  You feel nauseous or vomit.  Your hernia is stuck outside the abdomen, looks discolored, feels hard, or is tender.  You have any changes in your bowel habits or in the hernia that are unusual for you.  You have increased pain or swelling around the hernia.  You cannot push the hernia back in place by applying gentle pressure while lying down. MAKE SURE YOU:   Understand these instructions.  Will watch your condition.  Will get help right away if you are not doing well or get worse. Document Released: 04/05/2005 Document Revised: 06/28/2011 Document Reviewed: 11/23/2007 [facility] Patient Information 2015 [place], [place]. This information is not intended to replace advice given to you by your health care provider. Make sure you discuss any questions you have with your health care provider.

## 2014-02-06 ENCOUNTER — Encounter (HOSPITAL_COMMUNITY)
Admission: RE | Admit: 2014-02-06 | Discharge: 2014-02-06 | Disposition: A | Discharge status: Home / Self Care | Payer: Worker's Compensation | Source: Ambulatory Visit | Attending: General Surgery | Admitting: General Surgery

## 2014-02-06 ENCOUNTER — Encounter (HOSPITAL_COMMUNITY): Payer: Self-pay

## 2014-02-06 DIAGNOSIS — Z01818 Encounter for other preprocedural examination: Secondary | ICD-10-CM | POA: Insufficient documentation

## 2014-02-06 DIAGNOSIS — K429 Umbilical hernia without obstruction or gangrene: Secondary | ICD-10-CM | POA: Insufficient documentation

## 2014-02-06 HISTORY — DX: Other chronic pain: G89.29

## 2014-02-06 HISTORY — DX: Cardiac murmur, unspecified: R01.1

## 2014-02-06 HISTORY — DX: Dorsalgia, unspecified: M54.9

## 2014-02-06 HISTORY — DX: Personal history of urinary calculi: Z87.442

## 2014-02-06 LAB — CBC WITH DIFFERENTIAL/PLATELET
Basophils Absolute: 0.1 10*3/uL (ref 0.0–0.1)
Basophils Relative: 1 % (ref 0–1)
EOS ABS: 0.2 10*3/uL (ref 0.0–0.7)
Eosinophils Relative: 2 % (ref 0–5)
HEMATOCRIT: 42.8 % (ref 39.0–52.0)
HEMOGLOBIN: 14.3 g/dL (ref 13.0–17.0)
LYMPHS ABS: 3.8 10*3/uL (ref 0.7–4.0)
Lymphocytes Relative: 42 % (ref 12–46)
MCH: 28.9 pg (ref 26.0–34.0)
MCHC: 33.4 g/dL (ref 30.0–36.0)
MCV: 86.5 fL (ref 78.0–100.0)
MONO ABS: 0.7 10*3/uL (ref 0.1–1.0)
MONOS PCT: 7 % (ref 3–12)
NEUTROS ABS: 4.3 10*3/uL (ref 1.7–7.7)
NEUTROS PCT: 48 % (ref 43–77)
Platelets: 246 10*3/uL (ref 150–400)
RBC: 4.95 MIL/uL (ref 4.22–5.81)
RDW: 13.6 % (ref 11.5–15.5)
WBC: 8.9 10*3/uL (ref 4.0–10.5)

## 2014-02-06 LAB — BASIC METABOLIC PANEL
Anion gap: 12 (ref 5–15)
BUN: 12 mg/dL (ref 6–23)
CHLORIDE: 102 meq/L (ref 96–112)
CO2: 26 mEq/L (ref 19–32)
Calcium: 9.5 mg/dL (ref 8.4–10.5)
Creatinine, Ser: 1.02 mg/dL (ref 0.50–1.35)
GFR calc Af Amer: 87 mL/min — ABNORMAL LOW (ref >90)
GFR calc non Af Amer: 75 mL/min — ABNORMAL LOW (ref >90)
GLUCOSE: 126 mg/dL — AB (ref 70–99)
POTASSIUM: 4.2 meq/L (ref 3.7–5.3)
Sodium: 140 mEq/L (ref 137–147)

## 2014-02-06 NOTE — Pre-Procedure Instructions (Signed)
Patient given information to sign up for my chart at home. 

## 2014-02-12 ENCOUNTER — Encounter (HOSPITAL_COMMUNITY)
Admission: RE | Disposition: A | Discharge status: Home / Self Care | Payer: Self-pay | Source: Ambulatory Visit | Attending: General Surgery

## 2014-02-12 ENCOUNTER — Ambulatory Visit (HOSPITAL_COMMUNITY): Payer: Worker's Compensation | Admitting: Certified Registered Nurse Anesthetist

## 2014-02-12 ENCOUNTER — Encounter (HOSPITAL_COMMUNITY): Payer: Self-pay | Admitting: *Deleted

## 2014-02-12 ENCOUNTER — Ambulatory Visit (HOSPITAL_COMMUNITY)
Admission: RE | Admit: 2014-02-12 | Discharge: 2014-02-13 | Disposition: A | Discharge status: Home / Self Care | Payer: Worker's Compensation | Source: Ambulatory Visit | Attending: General Surgery | Admitting: General Surgery

## 2014-02-12 ENCOUNTER — Encounter (HOSPITAL_COMMUNITY): Payer: Worker's Compensation | Admitting: Certified Registered Nurse Anesthetist

## 2014-02-12 DIAGNOSIS — R011 Cardiac murmur, unspecified: Secondary | ICD-10-CM | POA: Insufficient documentation

## 2014-02-12 DIAGNOSIS — K420 Umbilical hernia with obstruction, without gangrene: Secondary | ICD-10-CM | POA: Diagnosis present

## 2014-02-12 DIAGNOSIS — IMO0001 Reserved for inherently not codable concepts without codable children: Secondary | ICD-10-CM

## 2014-02-12 DIAGNOSIS — Z01810 Encounter for preprocedural cardiovascular examination: Secondary | ICD-10-CM

## 2014-02-12 DIAGNOSIS — R030 Elevated blood-pressure reading, without diagnosis of hypertension: Secondary | ICD-10-CM

## 2014-02-12 HISTORY — PX: UMBILICAL HERNIA REPAIR: SHX196

## 2014-02-12 SURGERY — REPAIR, HERNIA, UMBILICAL, ADULT
Anesthesia: General | Site: Abdomen

## 2014-02-12 MED ORDER — LACTATED RINGERS IV SOLN
INTRAVENOUS | Status: DC
  Administered 2014-02-12: 1000 mL via INTRAVENOUS

## 2014-02-12 MED ORDER — GLYCOPYRROLATE 0.2 MG/ML IJ SOLN
INTRAMUSCULAR | Status: DC | PRN
  Administered 2014-02-12: 0.6 mg via INTRAVENOUS
  Administered 2014-02-12: 0.2 mg via INTRAVENOUS

## 2014-02-12 MED ORDER — DOCUSATE SODIUM 100 MG PO CAPS
100.0000 mg | ORAL_CAPSULE | Freq: Every day | ORAL | Status: DC
  Administered 2014-02-12: 100 mg via ORAL
  Filled 2014-02-12: qty 1

## 2014-02-12 MED ORDER — GLYCOPYRROLATE 0.2 MG/ML IJ SOLN
INTRAMUSCULAR | Status: AC
  Filled 2014-02-12: qty 3

## 2014-02-12 MED ORDER — ONDANSETRON HCL 4 MG/2ML IJ SOLN
INTRAMUSCULAR | Status: DC | PRN
  Administered 2014-02-12: 4 mg via INTRAVENOUS

## 2014-02-12 MED ORDER — SUCCINYLCHOLINE CHLORIDE 20 MG/ML IJ SOLN
INTRAMUSCULAR | Status: DC | PRN
  Administered 2014-02-12: 120 mg via INTRAVENOUS

## 2014-02-12 MED ORDER — 0.9 % SODIUM CHLORIDE (POUR BTL) OPTIME
TOPICAL | Status: DC | PRN
  Administered 2014-02-12: 1000 mL

## 2014-02-12 MED ORDER — BUPIVACAINE HCL (PF) 0.5 % IJ SOLN
INTRAMUSCULAR | Status: DC | PRN
  Administered 2014-02-12: 10 mL

## 2014-02-12 MED ORDER — MIDAZOLAM HCL 2 MG/2ML IJ SOLN
INTRAMUSCULAR | Status: AC
  Filled 2014-02-12: qty 2

## 2014-02-12 MED ORDER — LIDOCAINE HCL (CARDIAC) 10 MG/ML IV SOLN
INTRAVENOUS | Status: DC | PRN
  Administered 2014-02-12: 60 mg via INTRAVENOUS

## 2014-02-12 MED ORDER — FENTANYL CITRATE 0.05 MG/ML IJ SOLN
INTRAMUSCULAR | Status: AC
  Filled 2014-02-12: qty 5

## 2014-02-12 MED ORDER — STERILE WATER FOR IRRIGATION IR SOLN
Status: DC | PRN
  Administered 2014-02-12 (×2): 1000 mL

## 2014-02-12 MED ORDER — ROCURONIUM BROMIDE 100 MG/10ML IV SOLN
INTRAVENOUS | Status: DC | PRN
  Administered 2014-02-12: 30 mg via INTRAVENOUS
  Administered 2014-02-12: 10 mg via INTRAVENOUS

## 2014-02-12 MED ORDER — LACTATED RINGERS IV SOLN
INTRAVENOUS | Status: DC | PRN
  Administered 2014-02-12 (×2): via INTRAVENOUS

## 2014-02-12 MED ORDER — CEFAZOLIN SODIUM-DEXTROSE 2-3 GM-% IV SOLR
2.0000 g | Freq: Once | INTRAVENOUS | Status: DC
  Filled 2014-02-12: qty 50

## 2014-02-12 MED ORDER — ONDANSETRON HCL 4 MG/2ML IJ SOLN
4.0000 mg | Freq: Once | INTRAMUSCULAR | Status: AC
  Administered 2014-02-12: 4 mg via INTRAVENOUS

## 2014-02-12 MED ORDER — BACITRACIN ZINC 500 UNIT/GM EX OINT
TOPICAL_OINTMENT | CUTANEOUS | Status: AC
  Filled 2014-02-12: qty 0.9

## 2014-02-12 MED ORDER — ONDANSETRON HCL 4 MG/2ML IJ SOLN: 4.0000 mg | Freq: Four times a day (QID) | INTRAMUSCULAR | Status: DC | PRN

## 2014-02-12 MED ORDER — ONDANSETRON HCL 4 MG/2ML IJ SOLN
INTRAMUSCULAR | Status: AC
  Filled 2014-02-12: qty 2

## 2014-02-12 MED ORDER — ROCURONIUM BROMIDE 50 MG/5ML IV SOLN
INTRAVENOUS | Status: AC
  Filled 2014-02-12: qty 1

## 2014-02-12 MED ORDER — BUPIVACAINE HCL (PF) 0.5 % IJ SOLN
INTRAMUSCULAR | Status: AC
  Filled 2014-02-12: qty 30

## 2014-02-12 MED ORDER — FENTANYL CITRATE 0.05 MG/ML IJ SOLN
INTRAMUSCULAR | Status: DC | PRN
  Administered 2014-02-12: 100 ug via INTRAVENOUS
  Administered 2014-02-12: 50 ug via INTRAVENOUS

## 2014-02-12 MED ORDER — PROPOFOL 10 MG/ML IV BOLUS
INTRAVENOUS | Status: DC | PRN
  Administered 2014-02-12: 150 mg via INTRAVENOUS

## 2014-02-12 MED ORDER — FENTANYL CITRATE 0.05 MG/ML IJ SOLN
25.0000 ug | INTRAMUSCULAR | Status: DC | PRN
  Administered 2014-02-12 (×2): 50 ug via INTRAVENOUS
  Filled 2014-02-12: qty 2

## 2014-02-12 MED ORDER — SUCCINYLCHOLINE CHLORIDE 20 MG/ML IJ SOLN
INTRAMUSCULAR | Status: AC
  Filled 2014-02-12: qty 1

## 2014-02-12 MED ORDER — LEVOTHYROXINE SODIUM 50 MCG PO TABS
50.0000 ug | ORAL_TABLET | Freq: Every day | ORAL | Status: DC
  Administered 2014-02-13: 50 ug via ORAL
  Filled 2014-02-12: qty 1

## 2014-02-12 MED ORDER — NEOSTIGMINE METHYLSULFATE 10 MG/10ML IV SOLN
INTRAVENOUS | Status: DC | PRN
  Administered 2014-02-12: 4 mg via INTRAVENOUS

## 2014-02-12 MED ORDER — MORPHINE SULFATE 2 MG/ML IJ SOLN
1.0000 mg | INTRAMUSCULAR | Status: DC | PRN
  Administered 2014-02-12: 1 mg via INTRAVENOUS
  Filled 2014-02-12: qty 1

## 2014-02-12 MED ORDER — PROPOFOL 10 MG/ML IV BOLUS
INTRAVENOUS | Status: AC
  Filled 2014-02-12: qty 20

## 2014-02-12 MED ORDER — HYDROCODONE-ACETAMINOPHEN 5-325 MG PO TABS
1.0000 | ORAL_TABLET | Freq: Four times a day (QID) | ORAL | Status: DC | PRN
  Administered 2014-02-12: 1 via ORAL
  Filled 2014-02-12: qty 1

## 2014-02-12 MED ORDER — NEOSTIGMINE METHYLSULFATE 10 MG/10ML IV SOLN
INTRAVENOUS | Status: AC
  Filled 2014-02-12: qty 1

## 2014-02-12 MED ORDER — ONDANSETRON HCL 4 MG/2ML IJ SOLN: 4.0000 mg | Freq: Once | INTRAMUSCULAR | Status: DC | PRN

## 2014-02-12 MED ORDER — METHOCARBAMOL 500 MG PO TABS: 500.0000 mg | ORAL_TABLET | Freq: Three times a day (TID) | ORAL | Status: DC | PRN

## 2014-02-12 MED ORDER — CEFAZOLIN SODIUM-DEXTROSE 2-3 GM-% IV SOLR
INTRAVENOUS | Status: DC | PRN
  Administered 2014-02-12: 2 g via INTRAVENOUS

## 2014-02-12 MED ORDER — POTASSIUM CHLORIDE IN NACL 20-0.9 MEQ/L-% IV SOLN
INTRAVENOUS | Status: DC
  Administered 2014-02-12 – 2014-02-13 (×2): via INTRAVENOUS

## 2014-02-12 MED ORDER — SIMETHICONE 40 MG/0.6ML PO SUSP
ORAL | Status: AC
  Filled 2014-02-12: qty 1.2

## 2014-02-12 MED ORDER — LORATADINE 10 MG PO TABS
10.0000 mg | ORAL_TABLET | Freq: Every day | ORAL | Status: DC
  Administered 2014-02-12: 10 mg via ORAL
  Filled 2014-02-12: qty 1

## 2014-02-12 MED ORDER — PNEUMOCOCCAL VAC POLYVALENT 25 MCG/0.5ML IJ INJ
0.5000 mL | INJECTION | INTRAMUSCULAR | Status: AC
  Administered 2014-02-13: 0.5 mL via INTRAMUSCULAR
  Filled 2014-02-12: qty 0.5

## 2014-02-12 MED ORDER — ONDANSETRON HCL 4 MG PO TABS
4.0000 mg | ORAL_TABLET | Freq: Four times a day (QID) | ORAL | Status: DC | PRN
Start: 2014-02-12 — End: 2014-02-13
  Administered 2014-02-12: 4 mg via ORAL
  Filled 2014-02-12: qty 1

## 2014-02-12 MED ORDER — GLYCOPYRROLATE 0.2 MG/ML IJ SOLN
INTRAMUSCULAR | Status: AC
Start: 2014-02-12 — End: 2014-02-12
  Filled 2014-02-12: qty 1

## 2014-02-12 MED ORDER — LORAZEPAM 0.5 MG PO TABS
0.5000 mg | ORAL_TABLET | Freq: Every day | ORAL | Status: DC
  Administered 2014-02-12: 0.5 mg via ORAL
  Filled 2014-02-12: qty 1

## 2014-02-12 MED ORDER — MIDAZOLAM HCL 2 MG/2ML IJ SOLN
1.0000 mg | INTRAMUSCULAR | Status: DC | PRN
  Administered 2014-02-12: 2 mg via INTRAVENOUS

## 2014-02-12 SURGICAL SUPPLY — 47 items
ATTRACTOMAT 16X20 MAGNETIC DRP (DRAPES) ×3 IMPLANT
BAG HAMPER (MISCELLANEOUS) ×3 IMPLANT
CLEANER TIP ELECTROSURG 2X2 (MISCELLANEOUS) ×3 IMPLANT
CLOTH BEACON ORANGE TIMEOUT ST (SAFETY) ×3 IMPLANT
COVER LIGHT HANDLE STERIS (MISCELLANEOUS) ×6 IMPLANT
DECANTER SPIKE VIAL GLASS SM (MISCELLANEOUS) ×3 IMPLANT
ELECT REM PT RETURN 9FT ADLT (ELECTROSURGICAL) ×3
ELECTRODE REM PT RTRN 9FT ADLT (ELECTROSURGICAL) ×1 IMPLANT
FORMALIN 10 PREFIL 120ML (MISCELLANEOUS) IMPLANT
GAUZE SPONGE 4X4 12PLY STRL (GAUZE/BANDAGES/DRESSINGS) ×3 IMPLANT
GLOVE BIOGEL PI IND STRL 7.0 (GLOVE) ×1 IMPLANT
GLOVE BIOGEL PI IND STRL 7.5 (GLOVE) ×2 IMPLANT
GLOVE BIOGEL PI INDICATOR 7.0 (GLOVE) ×2
GLOVE BIOGEL PI INDICATOR 7.5 (GLOVE) ×4
GLOVE EXAM NITRILE MD LF STRL (GLOVE) ×3 IMPLANT
GLOVE SKINSENSE NS SZ7.0 (GLOVE) ×2
GLOVE SKINSENSE STRL SZ7.0 (GLOVE) ×1 IMPLANT
GOWN STRL REUS W/TWL LRG LVL3 (GOWN DISPOSABLE) ×6 IMPLANT
INST SET MINOR GENERAL (KITS) ×3 IMPLANT
KIT ROOM TURNOVER APOR (KITS) ×3 IMPLANT
MANIFOLD NEPTUNE II (INSTRUMENTS) ×3 IMPLANT
NS IRRIG 1000ML POUR BTL (IV SOLUTION) ×3 IMPLANT
PACK MINOR (CUSTOM PROCEDURE TRAY) ×3 IMPLANT
PAD ARMBOARD 7.5X6 YLW CONV (MISCELLANEOUS) ×3 IMPLANT
SET BASIN LINEN APH (SET/KITS/TRAYS/PACK) ×3 IMPLANT
SOL PREP PROV IODINE SCRUB 4OZ (MISCELLANEOUS) ×3 IMPLANT
SPONGE GAUZE 4X4 12PLY (GAUZE/BANDAGES/DRESSINGS) ×2 IMPLANT
SPONGE INTESTINAL PEANUT (DISPOSABLE) ×3 IMPLANT
SPONGE LAP 18X18 X RAY DECT (DISPOSABLE) ×3 IMPLANT
STAPLER VISISTAT 35W (STAPLE) ×3 IMPLANT
SUT PROLENE 0 CT 1 CR/8 (SUTURE) IMPLANT
SUT PROLENE 1 CT 1 30 (SUTURE) IMPLANT
SUT PROLENE 4 0 PS 2 18 (SUTURE) IMPLANT
SUT PROLENE MO 6 BLUE #0 30IN (SUTURE) IMPLANT
SUT SILK 2 0 (SUTURE) ×2
SUT SILK 2-0 18XBRD TIE 12 (SUTURE) ×1 IMPLANT
SUT VIC AB 3-0 SH 27 (SUTURE) ×2
SUT VIC AB 3-0 SH 27X BRD (SUTURE) ×1 IMPLANT
SUT VIC AB 5-0 P-3 18X BRD (SUTURE) IMPLANT
SUT VIC AB 5-0 P3 18 (SUTURE)
SUT VICRYL AB 3 0 TIES (SUTURE) ×3 IMPLANT
SUT VICRYL AB 3-0 BRD CT 36IN (SUTURE) ×6 IMPLANT
SYR BULB IRRIGATION 50ML (SYRINGE) ×3 IMPLANT
SYR CONTROL 10ML LL (SYRINGE) ×3 IMPLANT
TAPE CLOTH SURG 4X10 WHT LF (GAUZE/BANDAGES/DRESSINGS) ×3 IMPLANT
TRAY FOLEY CATH 16FR SILVER (SET/KITS/TRAYS/PACK) ×3 IMPLANT
WATER STERILE IRR 1000ML POUR (IV SOLUTION) ×6 IMPLANT

## 2014-02-12 NOTE — Progress Notes (Signed)
63 yr old W. Male for repair of work-related umbilical hernia.  Procedure and risks explained and the possibility of mesh being used was addressed.Informed co0nsent obtained.  Labs reviewed.  No clinical change since H&P.  Filed Vitals:   02/12/14 0715  BP: 143/85  Pulse:   Temp:   Resp: 19  pulse 56/min, temp 97.7.

## 2014-02-12 NOTE — Anesthesia Preprocedure Evaluation (Signed)
Anesthesia Evaluation  Patient identified by MRN, date of birth, ID band Patient awake    Reviewed: Allergy & Precautions, H&P , NPO status , Patient's Chart, lab work & pertinent test results  Airway Mallampati: I  TM Distance: >3 FB     Dental  (+) Teeth Intact   Pulmonary neg pulmonary ROS, former smoker,  breath sounds clear to auscultation        Cardiovascular negative cardio ROS  Rhythm:Regular Rate:Normal     Neuro/Psych    GI/Hepatic negative GI ROS,   Endo/Other  Hypothyroidism   Renal/GU      Musculoskeletal  (+) Arthritis - (chronic LBP),   Abdominal   Peds  Hematology   Anesthesia Other Findings   Reproductive/Obstetrics                             Anesthesia Physical Anesthesia Plan  ASA: II  Anesthesia Plan: General   Post-op Pain Management:    Induction: Intravenous  Airway Management Planned: Oral ETT  Additional Equipment:   Intra-op Plan:   Post-operative Plan: Extubation in OR  Informed Consent: I have reviewed the patients History and Physical, chart, labs and discussed the procedure including the risks, benefits and alternatives for the proposed anesthesia with the patient or authorized representative who has indicated his/her understanding and acceptance.     Plan Discussed with:   Anesthesia Plan Comments:         Anesthesia Quick Evaluation

## 2014-02-12 NOTE — Brief Op Note (Signed)
02/12/2014  8:42 AM  PATIENT:  [name] [name]  63 y.o. male  PRE-OPERATIVE DIAGNOSIS:  umbilical hernia   POST-OPERATIVE DIAGNOSIS:  incarcerated umbilical hernia   PROCEDURE:  Procedure(s): HERNIA REPAIR UMBILICAL ADULT (N/A)  SURGEON:  Surgeon(s) and Role:    * Scherry Ran, MD - Primary  PHYSICIAN ASSISTANT:   ASSISTANTS: none   ANESTHESIA:   general  EBL:  Total I/O In: 1000 [I.V.:1000] Out: 200 [Urine:200]  BLOOD ADMINISTERED:none  DRAINS: none   LOCAL MEDICATIONS USED:  MARCAINE     SPECIMEN:  Source of Specimen:  umbilical hernia sac and incarcerated omentum  DISPOSITION OF SPECIMEN:  PATHOLOGY  COUNTS:  YES  TOURNIQUET:  * No tourniquets in log *  DICTATION: .Other Dictation: Dictation Number OR dictation #   [id number].  PLAN OF CARE: Admit for overnight observation  PATIENT DISPOSITION:  PACU - hemodynamically stable.   Delay start of Pharmacological VTE agent (>24hrs) due to surgical blood loss or risk of bleeding: not applicable

## 2014-02-12 NOTE — Op Note (Signed)
NAME:  [name] [name], [name] [name] NO.:  [id number]  MEDICAL RECORD NO.:  [id number]  LOCATION:  [facility]                          FACILITY:  [facility]  PHYSICIAN:  Felicie Morn, M.D. DATE OF BIRTH:  1950-09-21  DATE OF PROCEDURE:  02/12/2014 DATE OF DISCHARGE:                              OPERATIVE REPORT   NOTE:  This is a 63 year old, white male who was referred to me for an umbilical hernia which occurred at work on January 01, 2014.  He was found to have a reducible hernia at that time.  He was then scheduled for elective repair.  At that time of his repair, he had an incarcerated umbilical hernia.  However, there was no compromised bowel or any infarcted tissue.  DIAGNOSIS:  Incarcerated umbilical hernia.  PROCEDURE:  Umbilical herniorrhaphy (without mesh repair)  WOUND CLASSIFICATION:  Clean.  SPECIMEN:  Omentum and hernia sac.  TECHNIQUE:  The patient was placed in supine position.  After the adequate administration of general anesthesia via endotracheal intubation, the patient was prepped with Betadine solution and draped in usual manner.  A Foley catheter was aseptically inserted.  Prior to this, a periumbilical incision was carried down to the inferior aspect of the umbilicus.  The skin, the subcutaneous tissue was incised and with a curvilinear incision, we then dissected free the skin of the umbilicus from the hernia sac and dissected the hernia sac opening the defect in the fascia little to make sure that we were able to remove the omentum that was incarcerated without any problem.  This was clamped with hemostats and ligated with 2-0 silk.  We checked for hemostasis and then closed the defect which was approximately the size of a 50 cent piece with interrupted 0 Prolene sutures in a figure-of-eight fashion. We then used 0.5% Sensorcaine about 10 mL periumbilically for postoperative comfort, then tacked the umbilical skin to the fascia to restore the  concave appearance of the umbilicus and then irrigated with normal saline solution and closed the skin with a stapling device.  No drains were placed.  There were no complications.  A sterile dressing was applied.  Prior to closure, all sponge, needle, and instrument counts were found to be correct.  Estimated blood loss was less than 25 mL. There were no complications.  The patient received a 1000 mL of crystalloids intraoperatively.  The patient will be admitted for observation and hopefully discharged today within 24 hours.     Felicie Morn, M.D.     WB/MEDQ  D:  02/12/2014  T:  02/12/2014  Job:  [id number]  cc:   Juanita Craver, M.D. Fax: [phone]

## 2014-02-12 NOTE — Addendum Note (Signed)
Addendum created 02/12/14 0954 by Michele Rockers, CRNA   Modules edited: Charges VN

## 2014-02-12 NOTE — Anesthesia Procedure Notes (Addendum)
Procedure Name: Intubation Date/Time: 02/12/2014 7:42 AM Performed by: Gershon Mussel, Taym Twist Pre-anesthesia Checklist: Patient identified, Patient being monitored, Timeout performed, Emergency Drugs available and Suction available Patient Re-evaluated:Patient Re-evaluated prior to inductionOxygen Delivery Method: Circle System Utilized Preoxygenation: Pre-oxygenation with 100% oxygen Intubation Type: IV induction Ventilation: Mask ventilation without difficulty Laryngoscope Size: Miller and 2 Grade View: Grade I Tube type: Oral Tube size: 7.0 mm Number of attempts: 1 Airway Equipment and Method: stylet Placement Confirmation: ETT inserted through vocal cords under direct vision,  positive ETCO2 and breath sounds checked- equal and bilateral Secured at: 21 cm Tube secured with: Tape Dental Injury: Teeth and Oropharynx as per pre-operative assessment    Performed by: Judieth Mckown

## 2014-02-12 NOTE — Anesthesia Postprocedure Evaluation (Signed)
  Anesthesia Post-op Note  Patient: [name] [name]  Procedure(s) Performed: Procedure(s): HERNIA REPAIR UMBILICAL ADULT (N/A)  Patient Location: PACU  Anesthesia Type:General  Level of Consciousness: awake, patient cooperative and responds to stimulation  Airway and Oxygen Therapy: Patient Spontanous Breathing and Patient connected to face mask oxygen  Post-op Pain: none  Post-op Assessment: Post-op Vital signs reviewed, Patient's Cardiovascular Status Stable, Respiratory Function Stable, Patent Airway, No signs of Nausea or vomiting, Adequate PO intake and Pain level controlled  Post-op Vital Signs: Reviewed and stable  Last Vitals:  Filed Vitals:   02/12/14 0848  BP:   Pulse:   Temp: 36.4 C  Resp:     Complications: No apparent anesthesia complications

## 2014-02-12 NOTE — Transfer of Care (Signed)
Immediate Anesthesia Transfer of Care Note  Patient: [name] [name]  Procedure(s) Performed: Procedure(s): HERNIA REPAIR UMBILICAL ADULT (N/A)  Patient Location: PACU  Anesthesia Type:General  Level of Consciousness: sedated, patient cooperative and responds to stimulation  Airway & Oxygen Therapy: Patient Spontanous Breathing and Patient connected to face mask oxygen  Post-op Assessment: Report given to PACU RN, Post -op Vital signs reviewed and stable and Patient moving all extremities  Post vital signs: Reviewed and stable  Complications: No apparent anesthesia complications

## 2014-02-12 NOTE — Progress Notes (Signed)
Post OP Check  Filed Vitals:   02/12/14 1317  BP: 137/73  Pulse: 55  Temp: 97.6 F (36.4 C)  Resp: 11    Pt awake and alert.  Wound clean and dry.  Expected incisional pain, but abdomen soft.  Has not voided.  Will discharge within 24 hrs.

## 2014-02-13 ENCOUNTER — Encounter (HOSPITAL_COMMUNITY): Payer: Self-pay | Admitting: General Surgery

## 2014-02-13 DIAGNOSIS — K420 Umbilical hernia with obstruction, without gangrene: Secondary | ICD-10-CM | POA: Diagnosis not present

## 2014-02-13 MED ORDER — DSS 100 MG PO CAPS: 100.0000 mg | ORAL_CAPSULE | Freq: Every day | ORAL | Status: DC

## 2014-02-13 NOTE — Care Management Utilization Note (Signed)
UR completed- OIB

## 2014-02-13 NOTE — Addendum Note (Signed)
Addendum created 02/13/14 1432 by Vista Deck, CRNA   Modules edited: Notes Section   Notes Section:  File: [id number]

## 2014-02-13 NOTE — Progress Notes (Signed)
AVS reviewed with patient and patient's wife.  Verbalized understanding of discharge instructions, physician follow-up, medications, incision care.  IV removed.  Site WNL.  Incision WNL.  Patient refused wheelchair.  Ambulated to main entrance for discharge.  Patient stable at time of discharge.

## 2014-02-13 NOTE — Anesthesia Postprocedure Evaluation (Signed)
  Anesthesia Post-op Note  Patient: [name] [name]  Procedure(s) Performed: Procedure(s): HERNIA REPAIR UMBILICAL ADULT (N/A)  Patient Location: discharged prior to visit today  Anesthesia Type:General  Level of Consciousness:   Airway and Oxygen Therapy:   Post-op Pain:   Post-op Assessment: Post-op Vital signs reviewed  Post-op Vital Signs: Reviewed and stable  Last Vitals:  Filed Vitals:   02/13/14 0926  BP: 111/81  Pulse: 65  Temp: 36.7 C  Resp: 20    Complications: No apparent anesthesia complications

## 2014-02-13 NOTE — Progress Notes (Signed)
POD # 1  Filed Vitals:   02/13/14 0926  BP: 111/81  Pulse: 65  Temp: 98.1 F (36.7 C)  Resp: 20   Pt doing very well post op.  Wound clean and dry and abdomen is completely soft. Pt tolerating PO well and voiding without dysuria.  Dressing was changed and follow up arranged.  Discharge note dictated, # [id number].

## 2014-02-13 NOTE — Discharge Summary (Signed)
[name] [name], [name] [name] NO.:  [id number]  MEDICAL RECORD NO.:  [id number]  LOCATION:  C950                          FACILITY:  [facility]  PHYSICIAN:  Felicie Morn, M.D. DATE OF BIRTH:  06/26/1950  DATE OF ADMISSION:  02/12/2014 DATE OF DISCHARGE:  10/28/2015LH                              DISCHARGE SUMMARY   DIAGNOSIS:  Umbilical hernia.  PROCEDURE:  On February 13, 7224, umbilical herniorrhaphy (no mesh required).  NOTE:  This is a 63 year old white male who had a workman's compensation referral for an umbilical hernia that occurred at work on January 01, 2014.  We planned for an elective repair after he was approved by the bureaucracy of the workman's compensation process.  He was taken to surgery via the outpatient department at the first opportunity.  He underwent elective repair that did not require the use of mesh, this was uneventful.  He was discharged within 24 hours from his surgery.  At the time of discharge, his abdomen was completely soft.  He was tolerating p.o. well.  His wound was clean without any sign of inflammation. He had no dysuria, leg pain, chest pain, or shortness of breath.  Followup instructions were given, and we will follow him perioperatively after which he is to return to his own physician who is Dr. Juanita Craver in [place] and I will follow him perioperatively.     Felicie Morn, M.D.     WB/MEDQ  D:  02/13/2014  T:  02/13/2014  Job:  [id number]  cc:   Juanita Craver, M.D. Fax: [phone]

## 2014-06-14 ENCOUNTER — Other Ambulatory Visit: Payer: Self-pay | Admitting: Physician Assistant

## 2014-06-14 DIAGNOSIS — D099 Carcinoma in situ, unspecified: Secondary | ICD-10-CM

## 2014-06-14 HISTORY — DX: Carcinoma in situ, unspecified: D09.9

## 2015-12-03 DIAGNOSIS — G8929 Other chronic pain: Secondary | ICD-10-CM | POA: Diagnosis not present

## 2015-12-03 DIAGNOSIS — E039 Hypothyroidism, unspecified: Secondary | ICD-10-CM | POA: Diagnosis not present

## 2015-12-03 DIAGNOSIS — M545 Low back pain: Secondary | ICD-10-CM | POA: Diagnosis not present

## 2015-12-24 DIAGNOSIS — H8113 Benign paroxysmal vertigo, bilateral: Secondary | ICD-10-CM | POA: Diagnosis not present

## 2016-01-20 ENCOUNTER — Other Ambulatory Visit: Payer: Self-pay | Admitting: Physician Assistant

## 2016-01-20 DIAGNOSIS — L570 Actinic keratosis: Secondary | ICD-10-CM | POA: Diagnosis not present

## 2016-01-20 DIAGNOSIS — C4492 Squamous cell carcinoma of skin, unspecified: Secondary | ICD-10-CM | POA: Insufficient documentation

## 2016-01-20 DIAGNOSIS — D0422 Carcinoma in situ of skin of left ear and external auricular canal: Secondary | ICD-10-CM | POA: Diagnosis not present

## 2016-01-20 DIAGNOSIS — D0439 Carcinoma in situ of skin of other parts of face: Secondary | ICD-10-CM | POA: Diagnosis not present

## 2016-01-20 DIAGNOSIS — L119 Acantholytic disorder, unspecified: Secondary | ICD-10-CM | POA: Diagnosis not present

## 2016-01-20 DIAGNOSIS — D485 Neoplasm of uncertain behavior of skin: Secondary | ICD-10-CM | POA: Diagnosis not present

## 2016-02-20 DIAGNOSIS — D0439 Carcinoma in situ of skin of other parts of face: Secondary | ICD-10-CM | POA: Diagnosis not present

## 2016-02-20 DIAGNOSIS — L570 Actinic keratosis: Secondary | ICD-10-CM | POA: Diagnosis not present

## 2016-02-20 DIAGNOSIS — D0422 Carcinoma in situ of skin of left ear and external auricular canal: Secondary | ICD-10-CM | POA: Diagnosis not present

## 2016-03-31 DIAGNOSIS — L570 Actinic keratosis: Secondary | ICD-10-CM | POA: Diagnosis not present

## 2016-04-02 DIAGNOSIS — H40033 Anatomical narrow angle, bilateral: Secondary | ICD-10-CM | POA: Diagnosis not present

## 2016-04-02 DIAGNOSIS — H04123 Dry eye syndrome of bilateral lacrimal glands: Secondary | ICD-10-CM | POA: Diagnosis not present

## 2016-04-26 DIAGNOSIS — J0100 Acute maxillary sinusitis, unspecified: Secondary | ICD-10-CM | POA: Diagnosis not present

## 2016-04-28 ENCOUNTER — Other Ambulatory Visit: Payer: Self-pay | Admitting: Physician Assistant

## 2016-04-28 DIAGNOSIS — D044 Carcinoma in situ of skin of scalp and neck: Secondary | ICD-10-CM | POA: Diagnosis not present

## 2016-04-28 DIAGNOSIS — L570 Actinic keratosis: Secondary | ICD-10-CM | POA: Diagnosis not present

## 2016-06-02 DIAGNOSIS — Z6833 Body mass index (BMI) 33.0-33.9, adult: Secondary | ICD-10-CM | POA: Diagnosis not present

## 2016-06-02 DIAGNOSIS — Z23 Encounter for immunization: Secondary | ICD-10-CM | POA: Diagnosis not present

## 2016-06-02 DIAGNOSIS — R740 Nonspecific elevation of levels of transaminase and lactic acid dehydrogenase [LDH]: Secondary | ICD-10-CM | POA: Diagnosis not present

## 2016-06-02 DIAGNOSIS — R7301 Impaired fasting glucose: Secondary | ICD-10-CM | POA: Diagnosis not present

## 2016-06-02 DIAGNOSIS — E039 Hypothyroidism, unspecified: Secondary | ICD-10-CM | POA: Diagnosis not present

## 2016-06-02 DIAGNOSIS — M5442 Lumbago with sciatica, left side: Secondary | ICD-10-CM | POA: Diagnosis not present

## 2016-06-02 DIAGNOSIS — G8929 Other chronic pain: Secondary | ICD-10-CM | POA: Diagnosis not present

## 2016-06-02 DIAGNOSIS — E6609 Other obesity due to excess calories: Secondary | ICD-10-CM | POA: Diagnosis not present

## 2016-06-02 DIAGNOSIS — M5441 Lumbago with sciatica, right side: Secondary | ICD-10-CM | POA: Diagnosis not present

## 2016-07-28 DIAGNOSIS — Z85828 Personal history of other malignant neoplasm of skin: Secondary | ICD-10-CM | POA: Diagnosis not present

## 2016-07-28 DIAGNOSIS — L570 Actinic keratosis: Secondary | ICD-10-CM | POA: Diagnosis not present

## 2016-07-28 DIAGNOSIS — D229 Melanocytic nevi, unspecified: Secondary | ICD-10-CM | POA: Diagnosis not present

## 2016-08-11 DIAGNOSIS — H903 Sensorineural hearing loss, bilateral: Secondary | ICD-10-CM | POA: Diagnosis not present

## 2016-12-06 DIAGNOSIS — E039 Hypothyroidism, unspecified: Secondary | ICD-10-CM | POA: Diagnosis not present

## 2016-12-06 DIAGNOSIS — R7303 Prediabetes: Secondary | ICD-10-CM | POA: Diagnosis not present

## 2016-12-13 DIAGNOSIS — R7303 Prediabetes: Secondary | ICD-10-CM | POA: Diagnosis not present

## 2016-12-13 DIAGNOSIS — M5442 Lumbago with sciatica, left side: Secondary | ICD-10-CM | POA: Diagnosis not present

## 2016-12-13 DIAGNOSIS — E039 Hypothyroidism, unspecified: Secondary | ICD-10-CM | POA: Diagnosis not present

## 2016-12-13 DIAGNOSIS — J301 Allergic rhinitis due to pollen: Secondary | ICD-10-CM | POA: Diagnosis not present

## 2016-12-29 DIAGNOSIS — L570 Actinic keratosis: Secondary | ICD-10-CM | POA: Diagnosis not present

## 2016-12-29 DIAGNOSIS — L820 Inflamed seborrheic keratosis: Secondary | ICD-10-CM | POA: Diagnosis not present

## 2016-12-29 DIAGNOSIS — D229 Melanocytic nevi, unspecified: Secondary | ICD-10-CM | POA: Diagnosis not present

## 2017-04-01 DIAGNOSIS — H40033 Anatomical narrow angle, bilateral: Secondary | ICD-10-CM | POA: Diagnosis not present

## 2017-04-01 DIAGNOSIS — H2513 Age-related nuclear cataract, bilateral: Secondary | ICD-10-CM | POA: Diagnosis not present

## 2017-04-27 DIAGNOSIS — M5416 Radiculopathy, lumbar region: Secondary | ICD-10-CM | POA: Diagnosis not present

## 2017-04-27 DIAGNOSIS — M5136 Other intervertebral disc degeneration, lumbar region: Secondary | ICD-10-CM | POA: Diagnosis not present

## 2017-04-27 DIAGNOSIS — M545 Low back pain: Secondary | ICD-10-CM | POA: Diagnosis not present

## 2017-05-26 DIAGNOSIS — M5416 Radiculopathy, lumbar region: Secondary | ICD-10-CM | POA: Diagnosis not present

## 2017-06-09 DIAGNOSIS — M5416 Radiculopathy, lumbar region: Secondary | ICD-10-CM | POA: Diagnosis not present

## 2017-06-09 DIAGNOSIS — M5136 Other intervertebral disc degeneration, lumbar region: Secondary | ICD-10-CM | POA: Diagnosis not present

## 2017-08-01 DIAGNOSIS — H6123 Impacted cerumen, bilateral: Secondary | ICD-10-CM | POA: Diagnosis not present

## 2017-11-07 DIAGNOSIS — M5441 Lumbago with sciatica, right side: Secondary | ICD-10-CM | POA: Diagnosis not present

## 2017-11-07 DIAGNOSIS — E039 Hypothyroidism, unspecified: Secondary | ICD-10-CM | POA: Diagnosis not present

## 2017-11-07 DIAGNOSIS — J301 Allergic rhinitis due to pollen: Secondary | ICD-10-CM | POA: Diagnosis not present

## 2017-11-07 DIAGNOSIS — R7303 Prediabetes: Secondary | ICD-10-CM | POA: Diagnosis not present

## 2017-11-07 DIAGNOSIS — G8929 Other chronic pain: Secondary | ICD-10-CM | POA: Diagnosis not present

## 2017-11-07 DIAGNOSIS — Z0000 Encounter for general adult medical examination without abnormal findings: Secondary | ICD-10-CM | POA: Diagnosis not present

## 2017-11-07 DIAGNOSIS — M5442 Lumbago with sciatica, left side: Secondary | ICD-10-CM | POA: Diagnosis not present

## 2018-02-15 ENCOUNTER — Other Ambulatory Visit: Payer: Self-pay | Admitting: Physician Assistant

## 2018-02-15 DIAGNOSIS — D485 Neoplasm of uncertain behavior of skin: Secondary | ICD-10-CM | POA: Diagnosis not present

## 2018-02-15 DIAGNOSIS — D0439 Carcinoma in situ of skin of other parts of face: Secondary | ICD-10-CM | POA: Diagnosis not present

## 2018-02-15 DIAGNOSIS — L570 Actinic keratosis: Secondary | ICD-10-CM | POA: Diagnosis not present

## 2018-03-31 DIAGNOSIS — H40033 Anatomical narrow angle, bilateral: Secondary | ICD-10-CM | POA: Diagnosis not present

## 2018-03-31 DIAGNOSIS — H2513 Age-related nuclear cataract, bilateral: Secondary | ICD-10-CM | POA: Diagnosis not present

## 2018-05-10 DIAGNOSIS — R7303 Prediabetes: Secondary | ICD-10-CM | POA: Diagnosis not present

## 2018-05-10 DIAGNOSIS — G8929 Other chronic pain: Secondary | ICD-10-CM | POA: Diagnosis not present

## 2018-05-10 DIAGNOSIS — J301 Allergic rhinitis due to pollen: Secondary | ICD-10-CM | POA: Diagnosis not present

## 2018-05-10 DIAGNOSIS — E039 Hypothyroidism, unspecified: Secondary | ICD-10-CM | POA: Diagnosis not present

## 2018-05-10 DIAGNOSIS — R030 Elevated blood-pressure reading, without diagnosis of hypertension: Secondary | ICD-10-CM | POA: Diagnosis not present

## 2018-05-10 DIAGNOSIS — M5441 Lumbago with sciatica, right side: Secondary | ICD-10-CM | POA: Diagnosis not present

## 2018-05-10 DIAGNOSIS — M5442 Lumbago with sciatica, left side: Secondary | ICD-10-CM | POA: Diagnosis not present

## 2018-05-24 ENCOUNTER — Other Ambulatory Visit: Payer: Self-pay | Admitting: Physician Assistant

## 2018-05-24 DIAGNOSIS — D0439 Carcinoma in situ of skin of other parts of face: Secondary | ICD-10-CM | POA: Diagnosis not present

## 2018-05-24 DIAGNOSIS — L570 Actinic keratosis: Secondary | ICD-10-CM | POA: Diagnosis not present

## 2018-06-13 DIAGNOSIS — I10 Essential (primary) hypertension: Secondary | ICD-10-CM | POA: Diagnosis not present

## 2018-06-23 DIAGNOSIS — D0439 Carcinoma in situ of skin of other parts of face: Secondary | ICD-10-CM | POA: Diagnosis not present

## 2018-10-16 ENCOUNTER — Other Ambulatory Visit: Payer: Self-pay

## 2018-10-16 ENCOUNTER — Emergency Department (HOSPITAL_COMMUNITY): Payer: PPO

## 2018-10-16 ENCOUNTER — Encounter (HOSPITAL_COMMUNITY): Payer: Self-pay | Admitting: Emergency Medicine

## 2018-10-16 ENCOUNTER — Emergency Department (HOSPITAL_COMMUNITY)
Admission: EM | Admit: 2018-10-16 | Discharge: 2018-10-16 | Disposition: A | Discharge status: Home / Self Care | Payer: PPO | Attending: Emergency Medicine | Admitting: Emergency Medicine

## 2018-10-16 DIAGNOSIS — Z87891 Personal history of nicotine dependence: Secondary | ICD-10-CM | POA: Insufficient documentation

## 2018-10-16 DIAGNOSIS — R102 Pelvic and perineal pain: Secondary | ICD-10-CM | POA: Diagnosis not present

## 2018-10-16 DIAGNOSIS — N3289 Other specified disorders of bladder: Secondary | ICD-10-CM

## 2018-10-16 DIAGNOSIS — R109 Unspecified abdominal pain: Secondary | ICD-10-CM

## 2018-10-16 DIAGNOSIS — R310 Gross hematuria: Secondary | ICD-10-CM | POA: Diagnosis not present

## 2018-10-16 DIAGNOSIS — R319 Hematuria, unspecified: Secondary | ICD-10-CM | POA: Diagnosis not present

## 2018-10-16 DIAGNOSIS — Z87442 Personal history of urinary calculi: Secondary | ICD-10-CM | POA: Diagnosis not present

## 2018-10-16 DIAGNOSIS — R945 Abnormal results of liver function studies: Secondary | ICD-10-CM | POA: Diagnosis not present

## 2018-10-16 DIAGNOSIS — N329 Bladder disorder, unspecified: Secondary | ICD-10-CM | POA: Diagnosis not present

## 2018-10-16 DIAGNOSIS — E039 Hypothyroidism, unspecified: Secondary | ICD-10-CM | POA: Insufficient documentation

## 2018-10-16 DIAGNOSIS — R1011 Right upper quadrant pain: Secondary | ICD-10-CM | POA: Diagnosis not present

## 2018-10-16 DIAGNOSIS — R7989 Other specified abnormal findings of blood chemistry: Secondary | ICD-10-CM

## 2018-10-16 DIAGNOSIS — Z79899 Other long term (current) drug therapy: Secondary | ICD-10-CM | POA: Diagnosis not present

## 2018-10-16 DIAGNOSIS — R509 Fever, unspecified: Secondary | ICD-10-CM | POA: Diagnosis not present

## 2018-10-16 DIAGNOSIS — R1032 Left lower quadrant pain: Secondary | ICD-10-CM | POA: Diagnosis present

## 2018-10-16 LAB — CBC WITH DIFFERENTIAL/PLATELET
Abs Immature Granulocytes: 0.01 10*3/uL (ref 0.00–0.07)
Basophils Absolute: 0 10*3/uL (ref 0.0–0.1)
Basophils Relative: 1 %
Eosinophils Absolute: 0 10*3/uL (ref 0.0–0.5)
Eosinophils Relative: 0 %
HCT: 44.7 % (ref 39.0–52.0)
Hemoglobin: 14.7 g/dL (ref 13.0–17.0)
Immature Granulocytes: 0 %
Lymphocytes Relative: 34 %
Lymphs Abs: 1.3 10*3/uL (ref 0.7–4.0)
MCH: 28 pg (ref 26.0–34.0)
MCHC: 32.9 g/dL (ref 30.0–36.0)
MCV: 85.1 fL (ref 80.0–100.0)
Monocytes Absolute: 0.2 10*3/uL (ref 0.1–1.0)
Monocytes Relative: 5 %
Neutro Abs: 2.2 10*3/uL (ref 1.7–7.7)
Neutrophils Relative %: 60 %
Platelets: 83 10*3/uL — ABNORMAL LOW (ref 150–400)
RBC: 5.25 MIL/uL (ref 4.22–5.81)
RDW: 13.6 % (ref 11.5–15.5)
WBC: 3.7 10*3/uL — ABNORMAL LOW (ref 4.0–10.5)
nRBC: 0 % (ref 0.0–0.2)

## 2018-10-16 LAB — URINALYSIS, ROUTINE W REFLEX MICROSCOPIC
Glucose, UA: NEGATIVE mg/dL
Ketones, ur: 5 mg/dL — AB
Nitrite: NEGATIVE
Protein, ur: 100 mg/dL — AB
RBC / HPF: >50 RBC/hpf — ABNORMAL HIGH (ref 0–5)
Specific Gravity, Urine: 1.032 — ABNORMAL HIGH (ref 1.005–1.030)
pH: 5 (ref 5.0–8.0)

## 2018-10-16 LAB — COMPREHENSIVE METABOLIC PANEL
ALT: 193 U/L — ABNORMAL HIGH (ref 0–44)
AST: 188 U/L — ABNORMAL HIGH (ref 15–41)
Albumin: 3.3 g/dL — ABNORMAL LOW (ref 3.5–5.0)
Alkaline Phosphatase: 297 U/L — ABNORMAL HIGH (ref 38–126)
Anion gap: 12 (ref 5–15)
BUN: 11 mg/dL (ref 8–23)
CO2: 21 mmol/L — ABNORMAL LOW (ref 22–32)
Calcium: 8.9 mg/dL (ref 8.9–10.3)
Chloride: 101 mmol/L (ref 98–111)
Creatinine, Ser: 1.07 mg/dL (ref 0.61–1.24)
GFR calc Af Amer: >60 mL/min (ref >60)
GFR calc non Af Amer: >60 mL/min (ref >60)
Glucose, Bld: 123 mg/dL — ABNORMAL HIGH (ref 70–99)
Potassium: 3.6 mmol/L (ref 3.5–5.1)
Sodium: 134 mmol/L — ABNORMAL LOW (ref 135–145)
Total Bilirubin: 1.8 mg/dL — ABNORMAL HIGH (ref 0.3–1.2)
Total Protein: 6.9 g/dL (ref 6.5–8.1)

## 2018-10-16 LAB — LIPASE, BLOOD: Lipase: 29 U/L (ref 11–51)

## 2018-10-16 MED ORDER — SODIUM CHLORIDE 0.9% FLUSH: 3.0000 mL | Freq: Once | INTRAVENOUS | Status: DC

## 2018-10-16 MED ORDER — IOHEXOL 300 MG/ML  SOLN
100.0000 mL | Freq: Once | INTRAMUSCULAR | Status: AC | PRN
  Administered 2018-10-16: 100 mL via INTRAVENOUS

## 2018-10-16 NOTE — ED Triage Notes (Signed)
Patient reports lower back pain that radiates from side to side since last Wednesday. Reports hx kidney stones and thinks it could be one because he's also had nausea and blood in his urine. Endorses fever this morning as well, 101F.

## 2018-10-16 NOTE — ED Provider Notes (Signed)
[facility] EMERGENCY DEPARTMENT Provider Note   CSN: [id number] Arrival date & time: 10/16/18  1247    History   Chief Complaint Chief Complaint  Patient presents with  . Nephrolithiasis  . Fever    HPI [name] [name] is a 68 y.o. male with history of chronic back pain, nephrolithiasis, hypothyroidism, seasonal allergies presents for evaluation of acute onset, progressively worsening back and flank pain with associated fever for 5 days.  He reports intermittent pain to the abdomen, flank, and back.  The pain will move around from day-to-day.  He notes associated nausea but no vomiting.  He has had fever since Wednesday with maximum temperature 102.64F at home and endorses generalized myalgias.  He also reports hematuria and dark urine.  He reports that the pain feels consistent with kidney stone pain he is experienced in the past.  He has been taking Tylenol with some improvement in his fever and pain.     The history is provided by the patient.    Past Medical History:  Diagnosis Date  . Chronic back pain   . Erectile dysfunction   . Heart murmur   . History of colonic polyps   . History of kidney stones   . Hypothyroidism   . Internal hemorrhoids   . Osteoarthritis   . Seasonal allergies     Patient Active Problem List   Diagnosis Date Noted  . Umbilical hernia, incarcerated 02/12/2014  . Preoperative cardiovascular examination 01/22/2014  . Murmur, cardiac 01/22/2014  . Elevated blood pressure 01/22/2014    Past Surgical History:  Procedure Laterality Date  . CARPAL TUNNEL RELEASE Bilateral   . ESL    . HERNIA REPAIR     umbilical  . KNEE ARTHROPLASTY Right   . KNEE ARTHROSCOPY Bilateral   . NASAL SEPTUM SURGERY    . UMBILICAL HERNIA REPAIR N/A 02/12/2014   Procedure: HERNIA REPAIR UMBILICAL ADULT;  Surgeon: Scherry Ran, MD;  Location: [facility];  Service: General;  Laterality: N/A;        Home Medications    Prior to Admission  medications   Medication Sig Start Date End Date Taking? Authorizing Provider  B Complex Vitamins (VITAMIN B COMPLEX PO) Take 1 tablet by mouth daily.    [provider]  cetirizine (ZYRTEC) 10 MG tablet Take 10 mg by mouth daily.    [provider]  Cholecalciferol (VITAMIN D PO) Take 1 tablet by mouth daily.    [provider]  docusate sodium 100 MG CAPS Take 100 mg by mouth daily. 02/13/14   Felicie Morn, MD  HYDROcodone-acetaminophen (NORCO/VICODIN) 5-325 MG per tablet Take 1 tablet by mouth every 6 (six) hours as needed for moderate pain.    [provider]  levothyroxine (SYNTHROID, LEVOTHROID) 50 MCG tablet Take 50 mcg by mouth daily before breakfast.    [provider]  methocarbamol (ROBAXIN) 500 MG tablet Take 500 mg by mouth every 8 (eight) hours as needed for muscle spasms.    [provider]  Multiple Vitamin (MULTIVITAMIN) tablet Take 1 tablet by mouth daily.    [provider]    Family History Family History  Problem Relation Age of Onset  . Heart disease Father   . Diabetes Mellitus II Father   . Cervical cancer Mother     Social History Social History   Tobacco Use  . Smoking status: Former Smoker    Packs/day: 1.00    Years: 25.00  Pack years: 25.00    Types: Cigarettes    Start date: 04/24/1958    Quit date: 04/24/1980    Years since quitting: 38.5  . Smokeless tobacco: Former Systems developer    Types: Chew  Substance Use Topics  . Alcohol use: Yes    Alcohol/week: 3.0 standard drinks    Types: 3 Cans of beer per week    Comment: Occasional  . Drug use: No     Allergies   Patient has no known allergies.   Review of Systems Review of Systems  Constitutional: Positive for fever.  Respiratory: Negative for cough and shortness of breath.   Gastrointestinal: Positive for abdominal pain and nausea. Negative for diarrhea and vomiting.  Genitourinary: Positive for flank pain and hematuria. Negative  for dysuria, frequency and urgency.  All other systems reviewed and are negative.    Physical Exam Updated Vital Signs BP (!) 141/81   Pulse 69   Temp 98.4 F (36.9 C) (Oral)   Resp 15   SpO2 97%   Physical Exam Vitals signs and nursing note reviewed.  Constitutional:      General: He is not in acute distress.    Appearance: He is well-developed.  HENT:     Head: Normocephalic and atraumatic.  Eyes:     General:        Right eye: No discharge.        Left eye: No discharge.     Conjunctiva/sclera: Conjunctivae normal.  Neck:     Musculoskeletal: Normal range of motion and neck supple.     Vascular: No JVD.     Trachea: No tracheal deviation.  Cardiovascular:     Rate and Rhythm: Normal rate and regular rhythm.  Pulmonary:     Effort: Pulmonary effort is normal.     Breath sounds: Normal breath sounds.  Abdominal:     General: Bowel sounds are normal. There is no distension.     Palpations: Abdomen is soft.     Tenderness: There is no abdominal tenderness. There is no right CVA tenderness, left CVA tenderness, guarding or rebound. Negative signs include Murphy's sign, Rovsing's sign and McBurney's sign.  Musculoskeletal:     Comments: No midline lumbar spine TTP, no paralumbar muscle tenderness, no deformity, crepitus, or step-off noted   Skin:    General: Skin is warm and dry.     Findings: No erythema.  Neurological:     Mental Status: He is alert.  Psychiatric:        Behavior: Behavior normal.      ED Treatments / Results  Labs (all labs ordered are listed, but only abnormal results are displayed) Labs Reviewed  COMPREHENSIVE METABOLIC PANEL - Abnormal; Notable for the following components:      Result Value   Sodium 134 (*)    CO2 21 (*)    Glucose, Bld 123 (*)    Albumin 3.3 (*)    AST 188 (*)    ALT 193 (*)    Alkaline Phosphatase 297 (*)    Total Bilirubin 1.8 (*)    All other components within normal limits  CBC WITH DIFFERENTIAL/PLATELET -  Abnormal; Notable for the following components:   WBC 3.7 (*)    Platelets 83 (*)    All other components within normal limits  URINALYSIS, ROUTINE W REFLEX MICROSCOPIC - Abnormal; Notable for the following components:   Color, Urine AMBER (*)    APPearance HAZY (*)    Specific Gravity, Urine 1.032 (*)  Hgb urine dipstick MODERATE (*)    Bilirubin Urine SMALL (*)    Ketones, ur 5 (*)    Protein, ur 100 (*)    Leukocytes,Ua TRACE (*)    RBC / HPF >50 (*)    Bacteria, UA FEW (*)    Non Squamous Epithelial 0-5 (*)    All other components within normal limits  URINE CULTURE  LIPASE, BLOOD    EKG None  Radiology No results found.  Procedures Procedures (including critical care time)  Medications Ordered in ED Medications  sodium chloride flush (NS) 0.9 % injection 3 mL (has no administration in time range)     Initial Impression / Assessment and Plan / ED Course  I have reviewed the triage vital signs and the nursing notes.  Pertinent labs & imaging results that were available during my care of the patient were reviewed by me and considered in my medical decision making (see chart for details).        Patient presents for evaluation of back/flank pain, abdominal pain, hematuria, and fever for 5 days.  Afebrile in the ED but reports he has been febrile up to 102 F at home, intermittently mildly hypertensive in the ED but vital signs otherwise stable.  He is nontoxic in appearance.  I cannot reproduce his pain on palpation of the abdomen or low back.  No peritoneal signs on examination of the abdomen and he is overall well-appearing.  Offered pain medicine and nausea medication and he declined as he feels comfortable presently.  Will obtain blood work and imaging and reassess.  UA concerning for nephrolithiasis versus possible hemorrhagic cystitis with some WBCs, trace leukesterase, and bacteria noted.  He has elevation in his LFTs which appears to be acute compared to  blood work obtained in January of this year using care everywhere.  Will add lipase, right upper quadrant ultrasound, and CT of the abdomen and pelvis for further evaluation.  Differential includes infected kidney stone, cholecystitis, ascending cholangitis.  He has a leukopenia which could be reactive secondary to infection.  Blood work thus far otherwise reassuring.  4:31 PM Signed out to oncoming provider Dr. Ronnald Nian. Pending RUQ [place] and CT abdomen/pelvis. May require consultation to general surgery or urology depending on results. Likely will require antibiotics and may require admission to the hospital.    Final Clinical Impressions(s) / ED Diagnoses   Final diagnoses:  Elevated LFTs  Hematuria, unspecified type  Acute flank pain    ED Discharge Orders    None       Debroah Baller 10/16/18 1632    Charlesetta Shanks, MD 10/17/18 [phone]

## 2018-10-16 NOTE — ED Provider Notes (Signed)
Patient awaiting urinalysis, lipase, CT scan abdomen pelvis, right upper quadrant ultrasound.  Patient has had right-sided flank pain for the last several days.  Some blood in his urine.  Liver enzymes mildly elevated, bilirubin mildly elevated.  Urinalysis with blood.  Lipase is normal.  CT scan shows possible neoplasm of the bladder.  Overall ultrasound is unremarkable.  Appears to likely has some adipose tissue in the liver.  No obvious lesion.  No infectious findings.  However, could be cancer related if patient does have a neoplasm in the bladder.  Overall, patient given information to follow-up with urology to have soft tissue mass evaluated.  Recommend close follow-up with primary care doctor to recheck liver enzymes as patient may need MRI of the liver to rule out any lesion there as well.  Discharged in ED in good condition and given return precautions.  This chart was dictated using voice recognition software.  Despite best efforts to proofread,  errors can occur which can change the documentation meaning.     Lennice Sites, DO 10/16/18 1740

## 2018-10-16 NOTE — ED Notes (Signed)
Pt contacting his wife on cell phone

## 2018-10-16 NOTE — ED Notes (Signed)
Patient transported to Ultrasound 

## 2018-10-17 LAB — URINE CULTURE: Culture: NO GROWTH

## 2018-10-18 DIAGNOSIS — R109 Unspecified abdominal pain: Secondary | ICD-10-CM | POA: Diagnosis not present

## 2018-10-18 DIAGNOSIS — Z7189 Other specified counseling: Secondary | ICD-10-CM | POA: Diagnosis not present

## 2018-10-19 DIAGNOSIS — Z20828 Contact with and (suspected) exposure to other viral communicable diseases: Secondary | ICD-10-CM | POA: Diagnosis not present

## 2018-10-19 DIAGNOSIS — R109 Unspecified abdominal pain: Secondary | ICD-10-CM | POA: Diagnosis not present

## 2018-10-24 DIAGNOSIS — N410 Acute prostatitis: Secondary | ICD-10-CM | POA: Diagnosis not present

## 2018-10-24 DIAGNOSIS — R310 Gross hematuria: Secondary | ICD-10-CM | POA: Diagnosis not present

## 2018-10-24 DIAGNOSIS — N281 Cyst of kidney, acquired: Secondary | ICD-10-CM | POA: Diagnosis not present

## 2018-10-24 DIAGNOSIS — D414 Neoplasm of uncertain behavior of bladder: Secondary | ICD-10-CM | POA: Diagnosis not present

## 2018-10-24 DIAGNOSIS — N200 Calculus of kidney: Secondary | ICD-10-CM | POA: Diagnosis not present

## 2018-10-27 ENCOUNTER — Other Ambulatory Visit: Payer: Self-pay | Admitting: Urology

## 2018-11-13 DIAGNOSIS — E039 Hypothyroidism, unspecified: Secondary | ICD-10-CM | POA: Diagnosis not present

## 2018-11-13 DIAGNOSIS — M5442 Lumbago with sciatica, left side: Secondary | ICD-10-CM | POA: Diagnosis not present

## 2018-11-13 DIAGNOSIS — I10 Essential (primary) hypertension: Secondary | ICD-10-CM | POA: Diagnosis not present

## 2018-11-13 DIAGNOSIS — R5383 Other fatigue: Secondary | ICD-10-CM | POA: Diagnosis not present

## 2018-11-13 DIAGNOSIS — Z0000 Encounter for general adult medical examination without abnormal findings: Secondary | ICD-10-CM | POA: Diagnosis not present

## 2018-11-13 DIAGNOSIS — R7303 Prediabetes: Secondary | ICD-10-CM | POA: Diagnosis not present

## 2018-11-13 DIAGNOSIS — M5441 Lumbago with sciatica, right side: Secondary | ICD-10-CM | POA: Diagnosis not present

## 2018-11-13 DIAGNOSIS — G8929 Other chronic pain: Secondary | ICD-10-CM | POA: Diagnosis not present

## 2018-11-17 ENCOUNTER — Encounter (HOSPITAL_COMMUNITY)
Admission: RE | Admit: 2018-11-17 | Discharge: 2018-11-17 | Disposition: A | Discharge status: Home / Self Care | Payer: PPO | Source: Ambulatory Visit | Attending: Urology | Admitting: Urology

## 2018-11-17 ENCOUNTER — Other Ambulatory Visit: Payer: Self-pay

## 2018-11-17 ENCOUNTER — Encounter (HOSPITAL_COMMUNITY): Payer: Self-pay

## 2018-11-17 DIAGNOSIS — Z01812 Encounter for preprocedural laboratory examination: Secondary | ICD-10-CM | POA: Insufficient documentation

## 2018-11-17 DIAGNOSIS — Z20828 Contact with and (suspected) exposure to other viral communicable diseases: Secondary | ICD-10-CM | POA: Diagnosis not present

## 2018-11-17 DIAGNOSIS — C679 Malignant neoplasm of bladder, unspecified: Secondary | ICD-10-CM | POA: Insufficient documentation

## 2018-11-17 HISTORY — DX: Essential (primary) hypertension: I10

## 2018-11-17 LAB — BASIC METABOLIC PANEL
Anion gap: 10 (ref 5–15)
BUN: 16 mg/dL (ref 8–23)
CO2: 21 mmol/L — ABNORMAL LOW (ref 22–32)
Calcium: 9 mg/dL (ref 8.9–10.3)
Chloride: 104 mmol/L (ref 98–111)
Creatinine, Ser: 1.05 mg/dL (ref 0.61–1.24)
GFR calc Af Amer: >60 mL/min (ref >60)
GFR calc non Af Amer: >60 mL/min (ref >60)
Glucose, Bld: 118 mg/dL — ABNORMAL HIGH (ref 70–99)
Potassium: 4.6 mmol/L (ref 3.5–5.1)
Sodium: 135 mmol/L (ref 135–145)

## 2018-11-17 LAB — CBC
HCT: 44.5 % (ref 39.0–52.0)
Hemoglobin: 14.6 g/dL (ref 13.0–17.0)
MCH: 29.1 pg (ref 26.0–34.0)
MCHC: 32.8 g/dL (ref 30.0–36.0)
MCV: 88.8 fL (ref 80.0–100.0)
Platelets: 278 10*3/uL (ref 150–400)
RBC: 5.01 MIL/uL (ref 4.22–5.81)
RDW: 13.9 % (ref 11.5–15.5)
WBC: 12 10*3/uL — ABNORMAL HIGH (ref 4.0–10.5)
nRBC: 0 % (ref 0.0–0.2)

## 2018-11-17 NOTE — Progress Notes (Signed)
ASA stopped 7/22 per Dr. Gloriann Loan

## 2018-11-17 NOTE — Patient Instructions (Signed)
YOU NEED TO HAVE A COVID 19 TEST ON Saturday __8/1_____ @_11 :40______,  THIS TEST MUST BE DONE BEFORE SURGERY, COME  [address], [place] , [id number].  ONCE YOUR COVID TEST IS COMPLETED, PLEASE BEGIN THE QUARANTINE INSTRUCTIONS AS OUTLINED IN YOUR HANDOUT.                [name] [name]   Your procedure is scheduled on: Wed 11/22/18   Report to [facility] Main  Entrance  Report to admitting at 6:30 AM   1 VISITOR IS ALLOWED TO WAIT IN WAITING ROOM  ONLY DAY OF YOUR SURGERY.  No Family in Short Stay at this time   Call this number if you have problems the morning of surgery [phone]    Remember: Do not eat food or drink liquids :After Midnight . BRUSH YOUR TEETH MORNING OF SURGERY AND RINSE YOUR MOUTH OUT, NO CHEWING GUM CANDY OR MINTS.     Take these medicines the morning of surgery with A SIP OF WATER:  Gabapentin, Zyrtec, Levothyroxine                                 You may not have any metal on your body including piercings                 Do not wear jewelry,  lotions, powders or , deodorant             Do not wear nail polish.  Do not shave  48 hours prior to surgery.              Men may shave face and neck.   Do not bring valuables to the hospital. [place].  Contacts, dentures or bridgework may not be worn into surgery.      Patients discharged the day of surgery will not be allowed to drive home.  IF YOU ARE HAVING SURGERY AND GOING HOME THE SAME DAY, YOU MUST HAVE AN ADULT TO DRIVE YOU HOME AND BE WITH YOU FOR 24 HOURS.  YOU MAY GO HOME BY TAXI OR UBER OR ORTHERWISE, BUT AN ADULT MUST ACCOMPANY YOU HOME AND STAY WITH YOU FOR 24 HOURS.  Name and phone number of your driver:  Special Instructions: N/A              Please read over the following fact sheets you were given: _____________________________________________________________________  [facility] - Preparing for Surgery  Before  surgery, you can play an important role.   Because skin is not sterile, your skin needs to be as free of germs as possible .  You can reduce the number of germs on your skin by washing with CHG (chlorahexidine gluconate) soap before surgery.   CHG is an antiseptic cleaner which kills germs and bonds with the skin to continue killing germs even after washing. Please DO NOT use if you have an allergy to CHG or antibacterial soaps.   If your skin becomes reddened/irritated stop using the CHG and inform your nurse when you arrive at Short Stay. .  You may shave your face/neck. Please follow these instructions carefully:  1.  Shower with CHG Soap the night before surgery and the  morning of Surgery.  2.  If you choose to wash your hair, wash your hair  first as usual with your  normal  shampoo.  3.  After you shampoo, rinse your hair and body thoroughly to remove the  shampoo.                                        4.  Use CHG as you would any other liquid soap.  You can apply chg directly  to the skin and wash                       Gently with a scrungie or clean washcloth.  5.  Apply the CHG Soap to your body ONLY FROM THE NECK DOWN.   Do not use on face/ open                           Wound or open sores. Avoid contact with eyes, ears mouth and genitals (private parts).                       Wash face,  Genitals (private parts) with your normal soap.             6.  Wash thoroughly, paying special attention to the area where your surgery  will be performed.  7.  Thoroughly rinse your body with warm water from the neck down.  8.  DO NOT shower/wash with your normal soap after using and rinsing off  the CHG Soap.             9.  Pat yourself dry with a clean towel.            10.  Wear clean pajamas.            11.  Place clean sheets on your bed the night of your first shower and do not  sleep with pets.  Day of Surgery : Do not apply any lotions/deodorants the morning of surgery.  Please wear  clean clothes to the hospital/surgery center.   FAILURE TO FOLLOW THESE INSTRUCTIONS MAY RESULT IN THE CANCELLATION OF YOUR SURGERY PATIENT SIGNATURE_________________________________  NURSE SIGNATURE__________________________________  ________________________________________________________________________

## 2018-11-18 ENCOUNTER — Other Ambulatory Visit (HOSPITAL_COMMUNITY)
Admission: RE | Admit: 2018-11-18 | Discharge: 2018-11-18 | Disposition: A | Discharge status: Home / Self Care | Payer: PPO | Source: Ambulatory Visit | Attending: Urology | Admitting: Urology

## 2018-11-18 ENCOUNTER — Inpatient Hospital Stay (HOSPITAL_COMMUNITY): Admission: RE | Admit: 2018-11-18 | Payer: Commercial Managed Care - PPO | Source: Ambulatory Visit

## 2018-11-18 DIAGNOSIS — Z01812 Encounter for preprocedural laboratory examination: Secondary | ICD-10-CM | POA: Diagnosis not present

## 2018-11-18 LAB — SARS CORONAVIRUS 2 (TAT 6-24 HRS): SARS Coronavirus 2: NEGATIVE

## 2018-11-22 ENCOUNTER — Other Ambulatory Visit: Payer: Self-pay

## 2018-11-22 ENCOUNTER — Encounter (HOSPITAL_COMMUNITY)
Admission: RE | Disposition: A | Discharge status: Home / Self Care | Payer: Self-pay | Source: Home / Self Care | Attending: Urology

## 2018-11-22 ENCOUNTER — Ambulatory Visit (HOSPITAL_COMMUNITY): Payer: PPO | Admitting: Physician Assistant

## 2018-11-22 ENCOUNTER — Ambulatory Visit (HOSPITAL_COMMUNITY): Payer: PPO

## 2018-11-22 ENCOUNTER — Ambulatory Visit (HOSPITAL_COMMUNITY)
Admission: RE | Admit: 2018-11-22 | Discharge: 2018-11-22 | Disposition: A | Discharge status: Home / Self Care | Payer: PPO | Attending: Urology | Admitting: Urology

## 2018-11-22 ENCOUNTER — Encounter (HOSPITAL_COMMUNITY): Payer: Self-pay | Admitting: Emergency Medicine

## 2018-11-22 DIAGNOSIS — Z87891 Personal history of nicotine dependence: Secondary | ICD-10-CM | POA: Insufficient documentation

## 2018-11-22 DIAGNOSIS — Z96653 Presence of artificial knee joint, bilateral: Secondary | ICD-10-CM | POA: Insufficient documentation

## 2018-11-22 DIAGNOSIS — Z87442 Personal history of urinary calculi: Secondary | ICD-10-CM | POA: Insufficient documentation

## 2018-11-22 DIAGNOSIS — E039 Hypothyroidism, unspecified: Secondary | ICD-10-CM | POA: Diagnosis not present

## 2018-11-22 DIAGNOSIS — M1990 Unspecified osteoarthritis, unspecified site: Secondary | ICD-10-CM | POA: Diagnosis not present

## 2018-11-22 DIAGNOSIS — I10 Essential (primary) hypertension: Secondary | ICD-10-CM | POA: Insufficient documentation

## 2018-11-22 DIAGNOSIS — C679 Malignant neoplasm of bladder, unspecified: Secondary | ICD-10-CM | POA: Diagnosis not present

## 2018-11-22 DIAGNOSIS — Z833 Family history of diabetes mellitus: Secondary | ICD-10-CM | POA: Insufficient documentation

## 2018-11-22 DIAGNOSIS — Z8249 Family history of ischemic heart disease and other diseases of the circulatory system: Secondary | ICD-10-CM | POA: Diagnosis not present

## 2018-11-22 DIAGNOSIS — D494 Neoplasm of unspecified behavior of bladder: Secondary | ICD-10-CM | POA: Diagnosis not present

## 2018-11-22 DIAGNOSIS — Z85828 Personal history of other malignant neoplasm of skin: Secondary | ICD-10-CM | POA: Diagnosis not present

## 2018-11-22 DIAGNOSIS — K420 Umbilical hernia with obstruction, without gangrene: Secondary | ICD-10-CM | POA: Diagnosis not present

## 2018-11-22 DIAGNOSIS — R011 Cardiac murmur, unspecified: Secondary | ICD-10-CM | POA: Diagnosis not present

## 2018-11-22 DIAGNOSIS — Z79899 Other long term (current) drug therapy: Secondary | ICD-10-CM | POA: Diagnosis not present

## 2018-11-22 DIAGNOSIS — Z823 Family history of stroke: Secondary | ICD-10-CM | POA: Diagnosis not present

## 2018-11-22 DIAGNOSIS — Z7982 Long term (current) use of aspirin: Secondary | ICD-10-CM | POA: Insufficient documentation

## 2018-11-22 DIAGNOSIS — K219 Gastro-esophageal reflux disease without esophagitis: Secondary | ICD-10-CM | POA: Insufficient documentation

## 2018-11-22 HISTORY — PX: CYSTOSCOPY W/ RETROGRADES: SHX1426

## 2018-11-22 HISTORY — PX: TRANSURETHRAL RESECTION OF BLADDER TUMOR WITH MITOMYCIN-C: SHX6459

## 2018-11-22 SURGERY — TRANSURETHRAL RESECTION OF BLADDER TUMOR WITH MITOMYCIN-C
Anesthesia: General

## 2018-11-22 MED ORDER — SODIUM CHLORIDE 0.9 % IR SOLN
Status: DC | PRN
  Administered 2018-11-22: 12000 mL

## 2018-11-22 MED ORDER — ONDANSETRON HCL 4 MG/2ML IJ SOLN
INTRAMUSCULAR | Status: AC
  Filled 2018-11-22: qty 2

## 2018-11-22 MED ORDER — FENTANYL CITRATE (PF) 100 MCG/2ML IJ SOLN
INTRAMUSCULAR | Status: AC
  Filled 2018-11-22: qty 2

## 2018-11-22 MED ORDER — SUGAMMADEX SODIUM 200 MG/2ML IV SOLN
INTRAVENOUS | Status: DC | PRN
  Administered 2018-11-22: 200 mg via INTRAVENOUS

## 2018-11-22 MED ORDER — PROPOFOL 10 MG/ML IV BOLUS
INTRAVENOUS | Status: DC | PRN
  Administered 2018-11-22: 120 mg via INTRAVENOUS

## 2018-11-22 MED ORDER — CEFAZOLIN SODIUM-DEXTROSE 2-4 GM/100ML-% IV SOLN
2.0000 g | INTRAVENOUS | Status: AC
  Administered 2018-11-22: 09:00:00 2 g via INTRAVENOUS
  Filled 2018-11-22: qty 100

## 2018-11-22 MED ORDER — FENTANYL CITRATE (PF) 100 MCG/2ML IJ SOLN
25.0000 ug | INTRAMUSCULAR | Status: DC | PRN
  Administered 2018-11-22: 50 ug via INTRAVENOUS

## 2018-11-22 MED ORDER — LIDOCAINE 2% (20 MG/ML) 5 ML SYRINGE
INTRAMUSCULAR | Status: AC
  Filled 2018-11-22: qty 5

## 2018-11-22 MED ORDER — KETOROLAC TROMETHAMINE 30 MG/ML IJ SOLN: 15.0000 mg | Freq: Once | INTRAMUSCULAR | Status: DC | PRN

## 2018-11-22 MED ORDER — DEXAMETHASONE SODIUM PHOSPHATE 10 MG/ML IJ SOLN
INTRAMUSCULAR | Status: DC | PRN
  Administered 2018-11-22: 10 mg via INTRAVENOUS

## 2018-11-22 MED ORDER — STERILE WATER FOR IRRIGATION IR SOLN
Status: DC | PRN
  Administered 2018-11-22: 500 mL

## 2018-11-22 MED ORDER — LACTATED RINGERS IV SOLN
INTRAVENOUS | Status: DC
  Administered 2018-11-22: 07:00:00 via INTRAVENOUS

## 2018-11-22 MED ORDER — IOHEXOL 300 MG/ML  SOLN
INTRAMUSCULAR | Status: DC | PRN
  Administered 2018-11-22: 7 mL

## 2018-11-22 MED ORDER — ROCURONIUM BROMIDE 10 MG/ML (PF) SYRINGE
PREFILLED_SYRINGE | INTRAVENOUS | Status: DC | PRN
  Administered 2018-11-22: 50 mg via INTRAVENOUS

## 2018-11-22 MED ORDER — ROCURONIUM BROMIDE 10 MG/ML (PF) SYRINGE
PREFILLED_SYRINGE | INTRAVENOUS | Status: AC
  Filled 2018-11-22: qty 10

## 2018-11-22 MED ORDER — ONDANSETRON HCL 4 MG/2ML IJ SOLN
INTRAMUSCULAR | Status: DC | PRN
  Administered 2018-11-22: 4 mg via INTRAVENOUS

## 2018-11-22 MED ORDER — FENTANYL CITRATE (PF) 100 MCG/2ML IJ SOLN
INTRAMUSCULAR | Status: DC | PRN
  Administered 2018-11-22: 100 ug via INTRAVENOUS

## 2018-11-22 MED ORDER — DEXAMETHASONE SODIUM PHOSPHATE 10 MG/ML IJ SOLN
INTRAMUSCULAR | Status: AC
  Filled 2018-11-22: qty 1

## 2018-11-22 MED ORDER — PROMETHAZINE HCL 25 MG/ML IJ SOLN: 6.2500 mg | INTRAMUSCULAR | Status: DC | PRN

## 2018-11-22 MED ORDER — PROPOFOL 10 MG/ML IV BOLUS
INTRAVENOUS | Status: AC
  Filled 2018-11-22: qty 20

## 2018-11-22 MED ORDER — LIDOCAINE 2% (20 MG/ML) 5 ML SYRINGE
INTRAMUSCULAR | Status: DC | PRN
  Administered 2018-11-22: 60 mg via INTRAVENOUS

## 2018-11-22 MED ORDER — GEMCITABINE CHEMO FOR BLADDER INSTILLATION 2000 MG
2000.0000 mg | Freq: Once | INTRAVENOUS | Status: AC
  Administered 2018-11-22: 2000 mg via INTRAVESICAL
  Filled 2018-11-22: qty 2000

## 2018-11-22 MED ORDER — SUCCINYLCHOLINE CHLORIDE 200 MG/10ML IV SOSY
PREFILLED_SYRINGE | INTRAVENOUS | Status: AC
  Filled 2018-11-22: qty 10

## 2018-11-22 SURGICAL SUPPLY — 22 items
BAG URINE DRAINAGE (UROLOGICAL SUPPLIES) ×4 IMPLANT
BAG URO CATCHER STRL LF (MISCELLANEOUS) ×4 IMPLANT
CATH FOLEY 2WAY SLVR  5CC 18FR (CATHETERS) ×2
CATH FOLEY 2WAY SLVR 5CC 18FR (CATHETERS) ×2 IMPLANT
CATH INTERMIT  6FR 70CM (CATHETERS) ×4 IMPLANT
CLOTH BEACON ORANGE TIMEOUT ST (SAFETY) ×4 IMPLANT
COVER WAND RF STERILE (DRAPES) IMPLANT
ELECT REM PT RETURN 15FT ADLT (MISCELLANEOUS) IMPLANT
GLOVE BIO SURGEON STRL SZ7.5 (GLOVE) ×4 IMPLANT
GOWN STRL REUS W/TWL LRG LVL3 (GOWN DISPOSABLE) ×8 IMPLANT
GUIDEWIRE STR DUAL SENSOR (WIRE) ×4 IMPLANT
KIT TURNOVER KIT A (KITS) IMPLANT
LOOP CUT BIPOLAR 24F LRG (ELECTROSURGICAL) ×4 IMPLANT
MANIFOLD NEPTUNE II (INSTRUMENTS) ×4 IMPLANT
PACK CYSTO (CUSTOM PROCEDURE TRAY) ×4 IMPLANT
PLUG CATH AND CAP STER (CATHETERS) ×4 IMPLANT
SET ASPIRATION TUBING (TUBING) ×4 IMPLANT
SYR 10ML LL (SYRINGE) ×4 IMPLANT
SYRINGE IRR TOOMEY STRL 70CC (SYRINGE) ×4 IMPLANT
TUBING CONNECTING 10 (TUBING) ×3 IMPLANT
TUBING CONNECTING 10' (TUBING) ×1
TUBING UROLOGY SET (TUBING) ×4 IMPLANT

## 2018-11-22 NOTE — Discharge Instructions (Signed)
Transurethral Resection of Bladder Tumor (TURBT)  FOLLOW UP: - Follow up will be on 11/29/18 for pathology discussion. We will also obtain a renal ultrasound to look at your kidneys.   If you develop right flank pain sooner please let us know  Definition:  Transurethral Resection of the Bladder Tumor is a surgical procedure used to diagnose and remove tumors within the bladder. TURBT is the most common treatment for early stage bladder cancer.   General instructions:  Your recent bladder surgery requires very little post hospital care but some definite precautions.  Despite the fact that no skin incisions were used, the area around the bladder resection site are raw and covered with scabs to promote healing and prevent bleeding. Certain precautions are needed to insure that the scabs are not disturbed over the next 2-4 weeks while the healing proceeds.   Because the raw surface inside your bladder and the irritating effects of urine you may expect frequency of urination and/or urgency (a stronger desire to urinate) and perhaps even getting up at night more often. This will usually resolve or improve slowly over the healing period. You may see some blood in your urine over the first 6 weeks. Do not be alarmed, even if the urine was clear for a while. Get off your feet and drink lots of fluids until clearing occurs. If you start to pass clots or don't improve call us.   Diet:  You may return to your normal diet immediately. Because of the raw surface of your bladder, alcohol, spicy foods, foods high in acid and drinks with caffeine may cause irritation or frequency and should be used in moderation. To keep your urine flowing freely and avoid constipation, drink plenty of fluids during the day (8-10 glasses). Tip: Avoid cranberry juice because it is very acidic.   Activity:  Your physical activity doesn't need to be restricted. However, if you are very active, you may see some blood in the urine. We  suggest that you reduce your activity under the circumstances until the bleeding has stopped.  Bowels:  It is important to keep your bowels regular during the postoperative period. Straining with bowel movements can cause bleeding. A bowel movement every other day is reasonable. Use a mild laxative if needed, such as milk of magnesia 2-3 tablespoons, or 2 Dulcolax tablets. Call if you continue to have problems. If you had been taking narcotics for pain, before, during or after your surgery, you may be constipated. Take a laxative if necessary.   Medication:  You should resume your pre-surgery medications unless told not to. In addition you may be given an antibiotic to prevent or treat infection. Antibiotics are not always necessary. All medication should be taken as prescribed until the bottles are finished unless you are having an unusual reaction to one of the drugs.  General Anesthetic, Adult  A doctor specialized in giving anesthesia (anesthesiologist) or a nurse specialized in giving anesthesia (nurse anesthetist) gives medicine that makes you sleep while a procedure is performed (general anesthetic). Once the general anesthetic has been administered, you will be in a sleeplike state in which you feel no pain. After having a general anesthetic you may feel:  Dizzy.  Weak.  Drowsy.  Confused.  These feelings are normal and can be expected to last for up to 24 hours after the procedure.   LET YOUR CAREGIVER KNOW ABOUT:  Allergies you have.  Medications you are taking, including herbs, eye drops, over the counter medications,  dietary supplements, and creams.  Previous problems you have had with anesthetics or numbing medicines.  Use of cigarettes, alcohol, or illicit drugs.  Possibility of pregnancy, if this applies.  History of bleeding or blood disorders, including blood clots and clotting disorders.  Previous surgeries you have had and types of anesthetics you have received.  Family  medical history, especially anesthetic problems.  Other health problems.  AFTER THE PROCEDURE  After surgery, you will be taken to the recovery area where a nurse will monitor your progress. You will be allowed to go home when you are awake, stable, taking fluids well, and without serious pain or complications.  For the first 24 hours following an anesthetic:  Have a responsible person with you.  Do not drive a car. If you are alone, do not take public transportation.  Do not engage in strenuous activity. You may usually resume normal activities the next day, or as advised by your caregiver.  Do not drink alcohol.  Do not take medicine that has not been prescribed by your caregiver.  Do not sign important papers or make important decisions as your judgement may be impaired.  You may resume a normal diet as directed.  Change bandages (dressings) as directed.  Only take over-the-counter or prescription medicines for pain, discomfort, or fever as directed by your caregiver.  If you have questions or problems that seem related to the anesthetic, call the hospital and ask for the anesthetist, anesthesiologist, or anesthesia department.  SEEK IMMEDIATE MEDICAL CARE IF:  You develop a rash.  You have difficulty breathing.  You have chest pain.  You have allergic problems.  You have uncontrolled nausea.  You have uncontrolled vomiting.  You develop any serious bleeding, especially from the incision site.  Document Released: 07/13/2007 Document Revised: 12/16/2010 Document Reviewed: 08/06/2010  [facility] Patient Information 2012 [place].     General Anesthesia, Adult, Care After This sheet gives you information about how to care for yourself after your procedure. Your health care provider may also give you more specific instructions. If you have problems or questions, contact your health care provider. What can I expect after the procedure? After the procedure, the following side effects  are common:  Pain or discomfort at the IV site.  Nausea.  Vomiting.  Sore throat.  Trouble concentrating.  Feeling cold or chills.  Weak or tired.  Sleepiness and fatigue.  Soreness and body aches. These side effects can affect parts of the body that were not involved in surgery. Follow these instructions at home:  For at least 24 hours after the procedure:  Have a responsible adult stay with you. It is important to have someone help care for you until you are awake and alert.  Rest as needed.  Do not: ? Participate in activities in which you could fall or become injured. ? Drive. ? Use heavy machinery. ? Drink alcohol. ? Take sleeping pills or medicines that cause drowsiness. ? Make important decisions or sign legal documents. ? Take care of children on your own. Eating and drinking  Follow any instructions from your health care provider about eating or drinking restrictions.  When you feel hungry, start by eating small amounts of foods that are soft and easy to digest (bland), such as toast. Gradually return to your regular diet.  Drink enough fluid to keep your urine pale yellow.  If you vomit, rehydrate by drinking water, juice, or clear broth. General instructions  If you have sleep apnea, surgery  and certain medicines can increase your risk for breathing problems. Follow instructions from your health care provider about wearing your sleep device: ? Anytime you are sleeping, including during daytime naps. ? While taking prescription pain medicines, sleeping medicines, or medicines that make you drowsy.  Return to your normal activities as told by your health care provider. Ask your health care provider what activities are safe for you.  Take over-the-counter and prescription medicines only as told by your health care provider.  If you smoke, do not smoke without supervision.  Keep all follow-up visits as told by your health care provider. This is  important. Contact a health care provider if:  You have nausea or vomiting that does not get better with medicine.  You cannot eat or drink without vomiting.  You have pain that does not get better with medicine.  You are unable to pass urine.  You develop a skin rash.  You have a fever.  You have redness around your IV site that gets worse. Get help right away if:  You have difficulty breathing.  You have chest pain.  You have blood in your urine or stool, or you vomit blood. Summary  After the procedure, it is common to have a sore throat or nausea. It is also common to feel tired.  Have a responsible adult stay with you for the first 24 hours after general anesthesia. It is important to have someone help care for you until you are awake and alert.  When you feel hungry, start by eating small amounts of foods that are soft and easy to digest (bland), such as toast. Gradually return to your regular diet.  Drink enough fluid to keep your urine pale yellow.  Return to your normal activities as told by your health care provider. Ask your health care provider what activities are safe for you. This information is not intended to replace advice given to you by your health care provider. Make sure you discuss any questions you have with your health care provider. Document Released: 07/12/2000 Document Revised: 04/08/2017 Document Reviewed: 11/19/2016 Elsevier Patient Education  2020 [employer].

## 2018-11-22 NOTE — Op Note (Signed)
Operative Note  Preoperative diagnosis:  1.  Bladder tumor  Postoperative diagnosis: 1.  Bladder tumor  Procedure(s): 1.  Transurethral resection of bladder tumor backslash medium 2.  Left retrograde pyelogram 3.  Intravesical instillation of chemotherapy- gemcitabine  Surgeon: Link Snuffer, MD  Assistants: Tharon Aquas, MD, resident  Anesthesia: General  Complications: None immediate  EBL: Minimal  Specimens: 1.  Bladder tumor  Drains/Catheters: 1.  18 French Foley catheter  Intraoperative findings: 1.  Normal anterior urethra 2.  Borderline obstructing prostate 3.  Approximately 3 cm superficial appearing bladder tumor overlying the right ureteral orifice which was unable to be identified and did have to be resected over.  All tumor appeared to be resected 4.  Left retrograde pyelogram was normal without filling defect or hydronephrosis  Indication: 68 year old male found to have a bladder tumor presents for the previously mentioned operation  Description of procedure:  The patient was identified and consent was obtained.  The patient was taken to the operating room and placed in the supine position.  The patient was placed under general anesthesia.  Perioperative antibiotics were administered.  The patient was placed in dorsal lithotomy.  Patient was prepped and draped in a standard sterile fashion and a timeout was performed.  A 21 French cystoscope was advanced into the urethra and into the bladder.  Complete cystoscopy was performed with the findings noted above.  The left ureter was cannulated with an open-ended ureteral catheter and a retrograde pyelogram was performed.  The findings are noted above.  The right ureteral orifice was unable to be identified because it was covered in tumor.  Therefore withdrew the scope and advanced a 62 French resectoscope with the visual obturator in place into the urethra and into the bladder.  I proceeded to resect the tumor of interest  on bipolar settings.  Resection bed was fulgurated.  I tried not to fulgurate around the ureteral orifice as much as possible.  The ureteral orifice was never able to be identified.  Bladder tumor specimens were collected.  I reinspected the bladder and there were no other tumors seen.  There was no active bleeding.  The scope was withdrawn and a Foley catheter placed.  Patient tolerated procedure well and was stable postoperatively.  In the PACU, intravesical gemcitabine was instilled into the bladder where it remained for approximately 1 hour prior to proper disposal.  Plan: Follow-up in 1 week with renal ultrasound and pathology review

## 2018-11-22 NOTE — Anesthesia Postprocedure Evaluation (Signed)
Anesthesia Post Note  Patient: [name] [name]  Procedure(s) Performed: CYSTOSCOPY, TRANSURETHRAL RESECTION OF BLADDER TUMOR (N/A ) CYSTOSCOPY WITH BILATERAL RETROGRADE PYELOGRAM (Left )     Patient location during evaluation: PACU Anesthesia Type: General Level of consciousness: awake and alert Pain management: pain level controlled Vital Signs Assessment: post-procedure vital signs reviewed and stable Respiratory status: spontaneous breathing, nonlabored ventilation, respiratory function stable and patient connected to nasal cannula oxygen Cardiovascular status: blood pressure returned to baseline and stable Postop Assessment: no apparent nausea or vomiting Anesthetic complications: no    Last Vitals:  Vitals:   11/22/18 1113 11/22/18 1203  BP: (!) 152/85 (!) 172/83  Pulse: 61 72  Resp: 16 16  Temp: 36.6 C 36.7 C  SpO2: 93% 97%    Last Pain:  Vitals:   11/22/18 1203  TempSrc:   PainSc: 0-No pain                 Shaquera Ansley S

## 2018-11-22 NOTE — Anesthesia Preprocedure Evaluation (Signed)
Anesthesia Evaluation  Patient identified by MRN, date of birth, ID band Patient awake    Reviewed: Allergy & Precautions, NPO status , Patient's Chart, lab work & pertinent test results  Airway Mallampati: II  TM Distance: >3 FB Neck ROM: Full    Dental no notable dental hx.    Pulmonary neg pulmonary ROS, former smoker,    Pulmonary exam normal breath sounds clear to auscultation       Cardiovascular hypertension, Pt. on medications Normal cardiovascular exam Rhythm:Regular Rate:Normal     Neuro/Psych negative neurological ROS  negative psych ROS   GI/Hepatic negative GI ROS, Neg liver ROS,   Endo/Other  Hypothyroidism   Renal/GU negative Renal ROS  negative genitourinary   Musculoskeletal negative musculoskeletal ROS (+)   Abdominal   Peds negative pediatric ROS (+)  Hematology negative hematology ROS (+)   Anesthesia Other Findings   Reproductive/Obstetrics negative OB ROS                             Anesthesia Physical Anesthesia Plan  ASA: II  Anesthesia Plan: General   Post-op Pain Management:    Induction: Intravenous  PONV Risk Score and Plan: 2 and Ondansetron, Dexamethasone and Treatment may vary due to age or medical condition  Airway Management Planned: Oral ETT  Additional Equipment:   Intra-op Plan:   Post-operative Plan: Extubation in OR  Informed Consent: I have reviewed the patients History and Physical, chart, labs and discussed the procedure including the risks, benefits and alternatives for the proposed anesthesia with the patient or authorized representative who has indicated his/her understanding and acceptance.     Dental advisory given  Plan Discussed with: CRNA and Surgeon  Anesthesia Plan Comments:         Anesthesia Quick Evaluation

## 2018-11-22 NOTE — Anesthesia Procedure Notes (Signed)
Procedure Name: Intubation Date/Time: 11/22/2018 8:43 AM Performed by: Sharlette Dense, CRNA Patient Re-evaluated:Patient Re-evaluated prior to induction Oxygen Delivery Method: Circle system utilized Preoxygenation: Pre-oxygenation with 100% oxygen Induction Type: IV induction Ventilation: Mask ventilation without difficulty and Oral airway inserted - appropriate to patient size Laryngoscope Size: Miller and 3 Grade View: Grade I Tube type: Oral Tube size: 7.5 mm Number of attempts: 1 Airway Equipment and Method: Stylet Placement Confirmation: ETT inserted through vocal cords under direct vision,  positive ETCO2 and breath sounds checked- equal and bilateral Secured at: 22 cm Tube secured with: Tape Dental Injury: Teeth and Oropharynx as per pre-operative assessment

## 2018-11-22 NOTE — Consult Note (Signed)
CC/HPI: Cc: Gross hematuria, bladder mass.  HPI:  10/24/2018  About 2 weeks ago, the patient experienced gross hematuria that lasted about a week. Hematuria has resolved. He recently underwent a CT of the abdomen and pelvis with contrast on 10/16/2018. This revealed a possible right-sided bladder mass that was 3 cm in size. He had no hydronephrosis on either side. He did have a 9 mm nonobstructing left kidney stone. He also had a right renal cyst. Urinalysis is negative today.     ALLERGIES: None   MEDICATIONS: Aspirin  Levothyroxine Sodium  Zyrtec  Fluticasone Propionate  Gabapentin  Tizanidine Hcl  Vitamin B  Vitamin D3     GU PSH: None     PSH Notes: nasal surgery     NON-GU PSH: Carpal tunnel surgery Knee replacement, Bilateral     GU PMH: None   NON-GU PMH: Arthritis GERD Hypertension Skin Cancer, History    FAMILY HISTORY: Cancer - Mother Diabetes - Father Heart problem - Father stroke - Mother   SOCIAL HISTORY: Marital Status: Married Preferred Language: English; Race: White Current Smoking Status: Patient does not smoke anymore.   Tobacco Use Assessment Completed: Used Tobacco in last 30 days? Light Drinker.  Drinks 2 caffeinated drinks per day.     Notes: 1 son, 1 daugter    REVIEW OF SYSTEMS:    GU Review Male:   Patient reports leakage of urine and erection problems. Patient denies frequent urination, hard to postpone urination, burning/ pain with urination, get up at night to urinate, stream starts and stops, trouble starting your stream, have to strain to urinate , and penile pain.  Gastrointestinal (Upper):   Patient reports indigestion/ heartburn. Patient denies nausea and vomiting.  Gastrointestinal (Lower):   Patient denies diarrhea and constipation.  Constitutional:   Patient reports fever, night sweats, and fatigue. Patient denies weight loss.  Skin:   Patient reports skin rash/ lesion. Patient denies itching.  Eyes:   Patient denies blurred  vision and double vision.  Ears/ Nose/ Throat:   Patient reports sore throat and sinus problems.   Hematologic/Lymphatic:   Patient denies swollen glands and easy bruising.  Cardiovascular:   Patient denies leg swelling and chest pains.  Respiratory:   Patient reports cough and shortness of breath.   Endocrine:   Patient denies excessive thirst.  Musculoskeletal:   Patient reports back pain and joint pain.   Neurological:   Patient reports headaches and dizziness.   Psychologic:   Patient reports anxiety. Patient denies depression.   VITAL SIGNS:      10/24/2018 10:48 AM  Weight 210 lb / 95.25 kg  Height 69 in / 175.26 cm  BP 149/89 mmHg  Pulse 66 /min  Temperature 98.0 F / 36.6 C  BMI 31.0 kg/m   GU PHYSICAL EXAMINATION:    Testes: No tenderness, no swelling, no enlargement left testes. No tenderness, no swelling, no enlargement right testes. Normal location left testes. Normal location right testes. No mass, no cyst, no varicocele, no hydrocele left testes. No mass, no cyst, no varicocele, no hydrocele right testes.  Penis: Circumcised, no warts, no cracks. No dorsal Peyronie's plaques, no left corporal Peyronie's plaques, no right corporal Peyronie's plaques, no scarring, no warts. No balanitis, no meatal stenosis.  Prostate: Prostate about 30 grams. Left lobe normal consistency, right lobe normal consistency. Symmetrical lobes. No prostate nodule. Left lobe no tenderness, right lobe no tenderness.    MULTI-SYSTEM PHYSICAL EXAMINATION:    Constitutional: Well-nourished. No physical  deformities. Normally developed. Good grooming.  Respiratory: No labored breathing, no use of accessory muscles.   Cardiovascular: Normal temperature, adequate perfusion of extremities  Skin: No paleness, no jaundice  Neurologic / Psychiatric: Oriented to time, oriented to place, oriented to person. No depression, no anxiety, no agitation.  Gastrointestinal: No mass, no tenderness, no rigidity, non obese  abdomen.  Eyes: Normal conjunctivae. Normal eyelids.  Musculoskeletal: Normal gait and station of head and neck.     PAST DATA REVIEWED:  Source Of History:  Patient  Records Review:   Previous Doctor Records, Previous Patient Records  X-Ray Review: C.T. Abdomen/Pelvis: Reviewed Films. Reviewed Report. Discussed With Patient.     PROCEDURES:         Flexible Cystoscopy - 52000  Risks, benefits, and some of the potential complications of the procedure were discussed at length with the patient including infection, bleeding, voiding discomfort, urinary retention, fever, chills, sepsis, and others. All questions were answered. Informed consent was obtained. Antibiotic prophylaxis was given. Sterile technique and intraurethral analgesia were used.  Meatus:  Normal size. Normal location. Normal condition.  Urethra:  No strictures.  External Sphincter:  Normal.  Verumontanum:  Normal.  Prostate:  Borderline obstructing. Mild hyperplasia.  Bladder Neck:  Non-obstructing.  Ureteral Orifices:  Left ureteral orifice visualized. I did not see the right ureteral orifice  Bladder:  Approximately 3 cm superficial appearing papillary bladder tumor extending from the middle of the trigone over to the right. I could not see the right ureteral orifice. Left ureteral orifice was easy to see      The lower urinary tract was carefully examined. The procedure was well-tolerated and without complications. Antibiotic instructions were given. Instructions were given to call the office immediately for bloody urine, difficulty urinating, urinary retention, painful or frequent urination, fever, chills, nausea, vomiting or other illness. The patient stated that he understood these instructions and would comply with them.         Urinalysis Dipstick Dipstick Cont'd  Color: Yellow Bilirubin: Neg mg/dL  Appearance: Clear Ketones: Neg mg/dL  Specific Gravity: 1.025 Blood: Neg ery/uL  pH: 6.0 Protein: Neg mg/dL   Glucose: Neg mg/dL Urobilinogen: 0.2 mg/dL    Nitrites: Neg    Leukocyte Esterase: Neg leu/uL    ASSESSMENT:      ICD-10 Details  1 GU:   Bladder tumor/neoplasm - D41.4   2   Gross hematuria - R31.0   3   Renal calculus - N20.0   4   Renal cyst - N28.1    PLAN:           Orders Labs Urine Culture          Document Letter(s):  Created for Patient: Clinical Summary         Notes:   proceed with cystoscopy, bilateral retrograde pyelogram, transurethral resection bladder tumor, possible installation of gemcitabine. He understands the potential for bleeding, infection, injury to surrounding structures, bladder perforation, need for additional procedures.   Cc: Clyde Lundborg, PA  Dr. Vallery Ridge    Signed by Link Snuffer, III, M.D. on 10/24/18 at 11:41 AM (EDT

## 2018-11-22 NOTE — Transfer of Care (Signed)
Immediate Anesthesia Transfer of Care Note  Patient: YANKY VANDERBURG  Procedure(s) Performed: CYSTOSCOPY, TRANSURETHRAL RESECTION OF BLADDER TUMOR (N/A ) CYSTOSCOPY WITH BILATERAL RETROGRADE PYELOGRAM (Left )  Patient Location: PACU  Anesthesia Type:General  Level of Consciousness: awake, alert  and oriented  Airway & Oxygen Therapy: Patient Spontanous Breathing and Patient connected to face mask oxygen  Post-op Assessment: Report given to RN and Post -op Vital signs reviewed and stable  Post vital signs: Reviewed and stable  Last Vitals:  Vitals Value Taken Time  BP 157/85 11/22/18 0941  Temp    Pulse 72 11/22/18 0942  Resp 19 11/22/18 0942  SpO2 100 % 11/22/18 0942  Vitals shown include unvalidated device data.  Last Pain:  Vitals:   11/22/18 0651  TempSrc: Oral  PainSc:       Patients Stated Pain Goal: 4 (27/25/36 6440)  Complications: No apparent anesthesia complications

## 2018-11-23 ENCOUNTER — Encounter (HOSPITAL_COMMUNITY): Payer: Self-pay | Admitting: Urology

## 2018-11-29 DIAGNOSIS — C678 Malignant neoplasm of overlapping sites of bladder: Secondary | ICD-10-CM | POA: Diagnosis not present

## 2019-01-05 ENCOUNTER — Other Ambulatory Visit: Payer: Self-pay | Admitting: Ophthalmology

## 2019-01-05 DIAGNOSIS — C678 Malignant neoplasm of overlapping sites of bladder: Secondary | ICD-10-CM | POA: Diagnosis not present

## 2019-01-05 DIAGNOSIS — C679 Malignant neoplasm of bladder, unspecified: Secondary | ICD-10-CM | POA: Diagnosis not present

## 2019-01-05 DIAGNOSIS — N3010 Interstitial cystitis (chronic) without hematuria: Secondary | ICD-10-CM | POA: Diagnosis not present

## 2019-01-12 DIAGNOSIS — R8279 Other abnormal findings on microbiological examination of urine: Secondary | ICD-10-CM | POA: Diagnosis not present

## 2019-01-12 DIAGNOSIS — C678 Malignant neoplasm of overlapping sites of bladder: Secondary | ICD-10-CM | POA: Diagnosis not present

## 2019-02-08 ENCOUNTER — Encounter: Payer: Self-pay | Admitting: *Deleted

## 2019-02-08 DIAGNOSIS — C4492 Squamous cell carcinoma of skin, unspecified: Secondary | ICD-10-CM

## 2019-02-09 DIAGNOSIS — Z5111 Encounter for antineoplastic chemotherapy: Secondary | ICD-10-CM | POA: Diagnosis not present

## 2019-02-09 DIAGNOSIS — C678 Malignant neoplasm of overlapping sites of bladder: Secondary | ICD-10-CM | POA: Diagnosis not present

## 2019-02-16 DIAGNOSIS — C678 Malignant neoplasm of overlapping sites of bladder: Secondary | ICD-10-CM | POA: Diagnosis not present

## 2019-02-16 DIAGNOSIS — Z5111 Encounter for antineoplastic chemotherapy: Secondary | ICD-10-CM | POA: Diagnosis not present

## 2019-02-23 DIAGNOSIS — C678 Malignant neoplasm of overlapping sites of bladder: Secondary | ICD-10-CM | POA: Diagnosis not present

## 2019-02-23 DIAGNOSIS — Z5111 Encounter for antineoplastic chemotherapy: Secondary | ICD-10-CM | POA: Diagnosis not present

## 2019-03-02 DIAGNOSIS — Z5111 Encounter for antineoplastic chemotherapy: Secondary | ICD-10-CM | POA: Diagnosis not present

## 2019-03-02 DIAGNOSIS — C678 Malignant neoplasm of overlapping sites of bladder: Secondary | ICD-10-CM | POA: Diagnosis not present

## 2019-03-09 DIAGNOSIS — Z5111 Encounter for antineoplastic chemotherapy: Secondary | ICD-10-CM | POA: Diagnosis not present

## 2019-03-09 DIAGNOSIS — C678 Malignant neoplasm of overlapping sites of bladder: Secondary | ICD-10-CM | POA: Diagnosis not present

## 2019-03-23 DIAGNOSIS — C678 Malignant neoplasm of overlapping sites of bladder: Secondary | ICD-10-CM | POA: Diagnosis not present

## 2019-03-23 DIAGNOSIS — Z5111 Encounter for antineoplastic chemotherapy: Secondary | ICD-10-CM | POA: Diagnosis not present

## 2019-05-16 DIAGNOSIS — Z125 Encounter for screening for malignant neoplasm of prostate: Secondary | ICD-10-CM | POA: Diagnosis not present

## 2019-05-16 DIAGNOSIS — I10 Essential (primary) hypertension: Secondary | ICD-10-CM | POA: Diagnosis not present

## 2019-05-16 DIAGNOSIS — G8929 Other chronic pain: Secondary | ICD-10-CM | POA: Diagnosis not present

## 2019-05-16 DIAGNOSIS — M5440 Lumbago with sciatica, unspecified side: Secondary | ICD-10-CM | POA: Diagnosis not present

## 2019-05-16 DIAGNOSIS — M65332 Trigger finger, left middle finger: Secondary | ICD-10-CM | POA: Diagnosis not present

## 2019-05-16 DIAGNOSIS — E119 Type 2 diabetes mellitus without complications: Secondary | ICD-10-CM | POA: Diagnosis not present

## 2019-05-16 DIAGNOSIS — J309 Allergic rhinitis, unspecified: Secondary | ICD-10-CM | POA: Diagnosis not present

## 2019-05-16 DIAGNOSIS — M65321 Trigger finger, right index finger: Secondary | ICD-10-CM | POA: Diagnosis not present

## 2019-05-16 DIAGNOSIS — E039 Hypothyroidism, unspecified: Secondary | ICD-10-CM | POA: Diagnosis not present

## 2019-05-18 DIAGNOSIS — C678 Malignant neoplasm of overlapping sites of bladder: Secondary | ICD-10-CM | POA: Diagnosis not present

## 2019-05-21 DIAGNOSIS — M65332 Trigger finger, left middle finger: Secondary | ICD-10-CM | POA: Diagnosis not present

## 2019-05-21 DIAGNOSIS — M65321 Trigger finger, right index finger: Secondary | ICD-10-CM | POA: Diagnosis not present

## 2019-06-13 DIAGNOSIS — M65321 Trigger finger, right index finger: Secondary | ICD-10-CM | POA: Diagnosis not present

## 2019-06-13 DIAGNOSIS — M65332 Trigger finger, left middle finger: Secondary | ICD-10-CM | POA: Diagnosis not present

## 2019-06-29 DIAGNOSIS — M65321 Trigger finger, right index finger: Secondary | ICD-10-CM | POA: Diagnosis not present

## 2019-06-29 DIAGNOSIS — M65332 Trigger finger, left middle finger: Secondary | ICD-10-CM | POA: Diagnosis not present

## 2019-07-30 DIAGNOSIS — M65321 Trigger finger, right index finger: Secondary | ICD-10-CM | POA: Diagnosis not present

## 2019-07-30 DIAGNOSIS — M65332 Trigger finger, left middle finger: Secondary | ICD-10-CM | POA: Diagnosis not present

## 2019-08-17 DIAGNOSIS — C678 Malignant neoplasm of overlapping sites of bladder: Secondary | ICD-10-CM | POA: Diagnosis not present

## 2019-08-30 DIAGNOSIS — Z1211 Encounter for screening for malignant neoplasm of colon: Secondary | ICD-10-CM | POA: Diagnosis not present

## 2019-08-30 DIAGNOSIS — K429 Umbilical hernia without obstruction or gangrene: Secondary | ICD-10-CM | POA: Diagnosis not present

## 2019-08-30 DIAGNOSIS — K439 Ventral hernia without obstruction or gangrene: Secondary | ICD-10-CM | POA: Diagnosis not present

## 2019-08-30 DIAGNOSIS — Z86010 Personal history of colonic polyps: Secondary | ICD-10-CM | POA: Diagnosis not present

## 2019-08-30 DIAGNOSIS — K5900 Constipation, unspecified: Secondary | ICD-10-CM | POA: Diagnosis not present

## 2019-09-10 DIAGNOSIS — M65332 Trigger finger, left middle finger: Secondary | ICD-10-CM | POA: Diagnosis not present

## 2019-09-10 DIAGNOSIS — M65321 Trigger finger, right index finger: Secondary | ICD-10-CM | POA: Diagnosis not present

## 2019-10-10 DIAGNOSIS — D122 Benign neoplasm of ascending colon: Secondary | ICD-10-CM | POA: Diagnosis not present

## 2019-10-10 DIAGNOSIS — D124 Benign neoplasm of descending colon: Secondary | ICD-10-CM | POA: Diagnosis not present

## 2019-10-10 DIAGNOSIS — K635 Polyp of colon: Secondary | ICD-10-CM | POA: Diagnosis not present

## 2019-10-10 DIAGNOSIS — Z1211 Encounter for screening for malignant neoplasm of colon: Secondary | ICD-10-CM | POA: Diagnosis not present

## 2019-10-10 DIAGNOSIS — D120 Benign neoplasm of cecum: Secondary | ICD-10-CM | POA: Diagnosis not present

## 2019-11-13 DIAGNOSIS — I10 Essential (primary) hypertension: Secondary | ICD-10-CM | POA: Diagnosis not present

## 2019-11-13 DIAGNOSIS — M5442 Lumbago with sciatica, left side: Secondary | ICD-10-CM | POA: Diagnosis not present

## 2019-11-13 DIAGNOSIS — E782 Mixed hyperlipidemia: Secondary | ICD-10-CM | POA: Diagnosis not present

## 2019-11-13 DIAGNOSIS — C678 Malignant neoplasm of overlapping sites of bladder: Secondary | ICD-10-CM | POA: Diagnosis not present

## 2019-11-13 DIAGNOSIS — E119 Type 2 diabetes mellitus without complications: Secondary | ICD-10-CM | POA: Diagnosis not present

## 2019-11-13 DIAGNOSIS — K449 Diaphragmatic hernia without obstruction or gangrene: Secondary | ICD-10-CM | POA: Diagnosis not present

## 2019-11-13 DIAGNOSIS — Z0000 Encounter for general adult medical examination without abnormal findings: Secondary | ICD-10-CM | POA: Diagnosis not present

## 2019-11-13 DIAGNOSIS — Z1159 Encounter for screening for other viral diseases: Secondary | ICD-10-CM | POA: Diagnosis not present

## 2019-11-13 DIAGNOSIS — N200 Calculus of kidney: Secondary | ICD-10-CM | POA: Diagnosis not present

## 2019-11-13 DIAGNOSIS — M5441 Lumbago with sciatica, right side: Secondary | ICD-10-CM | POA: Diagnosis not present

## 2019-11-13 DIAGNOSIS — I700 Atherosclerosis of aorta: Secondary | ICD-10-CM | POA: Diagnosis not present

## 2019-11-13 DIAGNOSIS — N3289 Other specified disorders of bladder: Secondary | ICD-10-CM | POA: Diagnosis not present

## 2019-11-13 DIAGNOSIS — J309 Allergic rhinitis, unspecified: Secondary | ICD-10-CM | POA: Diagnosis not present

## 2019-11-13 DIAGNOSIS — G8929 Other chronic pain: Secondary | ICD-10-CM | POA: Diagnosis not present

## 2019-11-13 DIAGNOSIS — E039 Hypothyroidism, unspecified: Secondary | ICD-10-CM | POA: Diagnosis not present

## 2019-11-15 DIAGNOSIS — N200 Calculus of kidney: Secondary | ICD-10-CM | POA: Diagnosis not present

## 2019-11-15 DIAGNOSIS — C678 Malignant neoplasm of overlapping sites of bladder: Secondary | ICD-10-CM | POA: Diagnosis not present

## 2019-11-16 DIAGNOSIS — H903 Sensorineural hearing loss, bilateral: Secondary | ICD-10-CM | POA: Diagnosis not present

## 2020-01-02 DIAGNOSIS — H6123 Impacted cerumen, bilateral: Secondary | ICD-10-CM | POA: Diagnosis not present

## 2020-01-02 DIAGNOSIS — H6121 Impacted cerumen, right ear: Secondary | ICD-10-CM | POA: Diagnosis not present

## 2020-01-02 DIAGNOSIS — H6122 Impacted cerumen, left ear: Secondary | ICD-10-CM | POA: Diagnosis not present

## 2020-01-09 DIAGNOSIS — E039 Hypothyroidism, unspecified: Secondary | ICD-10-CM | POA: Diagnosis not present

## 2020-02-15 DIAGNOSIS — C678 Malignant neoplasm of overlapping sites of bladder: Secondary | ICD-10-CM | POA: Diagnosis not present

## 2020-02-15 DIAGNOSIS — N200 Calculus of kidney: Secondary | ICD-10-CM | POA: Diagnosis not present

## 2020-02-15 DIAGNOSIS — N281 Cyst of kidney, acquired: Secondary | ICD-10-CM | POA: Diagnosis not present

## 2020-03-03 DIAGNOSIS — H04123 Dry eye syndrome of bilateral lacrimal glands: Secondary | ICD-10-CM | POA: Diagnosis not present

## 2020-03-07 DIAGNOSIS — E039 Hypothyroidism, unspecified: Secondary | ICD-10-CM | POA: Diagnosis not present

## 2020-03-21 DIAGNOSIS — H2513 Age-related nuclear cataract, bilateral: Secondary | ICD-10-CM | POA: Diagnosis not present

## 2020-03-21 DIAGNOSIS — H40033 Anatomical narrow angle, bilateral: Secondary | ICD-10-CM | POA: Diagnosis not present

## 2020-05-16 DIAGNOSIS — C678 Malignant neoplasm of overlapping sites of bladder: Secondary | ICD-10-CM | POA: Diagnosis not present

## 2020-08-21 DIAGNOSIS — C678 Malignant neoplasm of overlapping sites of bladder: Secondary | ICD-10-CM | POA: Diagnosis not present

## 2020-09-10 ENCOUNTER — Other Ambulatory Visit: Payer: Self-pay

## 2020-09-10 ENCOUNTER — Encounter: Payer: Self-pay | Admitting: Physician Assistant

## 2020-09-10 ENCOUNTER — Ambulatory Visit: Payer: PPO | Admitting: Physician Assistant

## 2020-09-10 DIAGNOSIS — Z85828 Personal history of other malignant neoplasm of skin: Secondary | ICD-10-CM

## 2020-09-10 DIAGNOSIS — D0439 Carcinoma in situ of skin of other parts of face: Secondary | ICD-10-CM | POA: Diagnosis not present

## 2020-09-10 DIAGNOSIS — D044 Carcinoma in situ of skin of scalp and neck: Secondary | ICD-10-CM | POA: Diagnosis not present

## 2020-09-10 DIAGNOSIS — D1800 Hemangioma unspecified site: Secondary | ICD-10-CM | POA: Diagnosis not present

## 2020-09-10 DIAGNOSIS — D229 Melanocytic nevi, unspecified: Secondary | ICD-10-CM | POA: Diagnosis not present

## 2020-09-10 DIAGNOSIS — L570 Actinic keratosis: Secondary | ICD-10-CM

## 2020-09-10 DIAGNOSIS — L821 Other seborrheic keratosis: Secondary | ICD-10-CM

## 2020-09-10 DIAGNOSIS — L578 Other skin changes due to chronic exposure to nonionizing radiation: Secondary | ICD-10-CM

## 2020-09-10 DIAGNOSIS — L814 Other melanin hyperpigmentation: Secondary | ICD-10-CM | POA: Diagnosis not present

## 2020-09-10 DIAGNOSIS — D485 Neoplasm of uncertain behavior of skin: Secondary | ICD-10-CM

## 2020-09-10 DIAGNOSIS — D0430 Carcinoma in situ of skin of unspecified part of face: Secondary | ICD-10-CM

## 2020-09-10 DIAGNOSIS — Z1283 Encounter for screening for malignant neoplasm of skin: Secondary | ICD-10-CM | POA: Diagnosis not present

## 2020-09-10 NOTE — Progress Notes (Addendum)
Follow-Up Visit   Subjective  [name] [name] is a 70 y.o. male who presents for the following: Annual Exam (Forehead & Left crown of scalp x 2-3 months crusty lesions).   The following portions of the chart were reviewed this encounter and updated as appropriate:  Tobacco  Allergies  Meds  Problems  Med Hx  Surg Hx  Fam Hx       Objective  Well appearing patient in no apparent distress; mood and affect are within normal limits.  All skin waist up examined.  Left Mid Parietal Scalp       Mid Parietal Scalp       Left Ala Nasi       Right Tip of Nose       Objective  Head - Anterior (Face) (10), Neck - Posterior, both cheeks, temples and forehead. (7): Erythematous patches with gritty scale.  Assessment & Plan  AK (actinic keratosis) (17) Head - Anterior (Face) (10); Neck - Posterior, both cheeks, temples and forehead. (7)  Destruction of lesion - Head - Anterior (Face), Neck - Posterior, both cheeks, temples and forehead. 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) Left Mid Parietal Scalp  Skin / nail biopsy Type of biopsy: tangential   Informed consent: discussed and consent obtained   Timeout: patient name, date of birth, surgical site, and procedure verified   Procedure prep:  Patient was prepped and draped in usual sterile fashion (Non sterile) Prep type:  Chlorhexidine Anesthesia: the lesion was anesthetized in a standard fashion   Anesthetic:  1% lidocaine w/ epinephrine 1-100,000 local infiltration Instrument used: flexible razor blade   Hemostasis achieved with:  aluminum chloride   Outcome: patient tolerated procedure well   Post-procedure details: sterile dressing applied and wound care instructions given   Dressing type: bandage and petrolatum    Destruction of lesion Complexity: simple   Destruction method: electrodesiccation and curettage   Informed consent: discussed and consent obtained   Timeout:  patient name, date of birth, surgical site, and procedure verified Anesthesia: the lesion was anesthetized in a standard fashion   Anesthetic:  1% lidocaine w/ epinephrine 1-100,000 local infiltration Curettage performed in three different directions: Yes   Curettage cycles:  1 Margin per side (cm):  0.1 Final wound size (cm):  1 Hemostasis achieved with:  aluminum chloride Outcome: patient tolerated procedure well with no complications   Post-procedure details: sterile dressing applied and wound care instructions given   Dressing type: bandage and petrolatum    Specimen 1 - Surgical pathology Differential Diagnosis: R/O BCC vs SCC - treated after biopsy  Check Margins: No  Mid Parietal Scalp  Skin / nail biopsy Type of biopsy: tangential   Informed consent: discussed and consent obtained   Timeout: patient name, date of birth, surgical site, and procedure verified   Procedure prep:  Patient was prepped and draped in usual sterile fashion (Non sterile) Prep type:  Chlorhexidine Anesthesia: the lesion was anesthetized in a standard fashion   Anesthetic:  1% lidocaine w/ epinephrine 1-100,000 local infiltration Instrument used: flexible razor blade   Outcome: patient tolerated procedure well  Post-procedure details: sterile dressing applied and wound care instructions given   Dressing type: bandage and petrolatum    Destruction of lesion Complexity: simple   Destruction method: electrodesiccation and curettage   Informed consent: discussed and consent obtained   Timeout:  patient name, date of birth, surgical site, and procedure  verified Anesthesia: the lesion was anesthetized in a standard fashion   Anesthetic:  1% lidocaine w/ epinephrine 1-100,000 local infiltration Curettage performed in three different directions: Yes   Curettage cycles:  1 Margin per side (cm):  0.1 Final wound size (cm):  0.9 Hemostasis achieved with:  aluminum chloride Outcome: patient tolerated procedure well with no complications   Post-procedure details: sterile dressing applied and wound care instructions given   Dressing type: bandage and petrolatum    Specimen 2 - Surgical pathology Differential Diagnosis: R/O BCC vs SCC - treated after biopsy  Check Margins: No  Carcinoma in situ of skin of face, unspecified location Left Ala Nasi  Skin / nail biopsy Type of biopsy: tangential   Informed consent: discussed and consent obtained   Timeout: patient name, date of birth, surgical site, and procedure verified   Procedure prep:  Patient was prepped and draped in usual sterile fashion (Non sterile) Prep type:  Chlorhexidine Anesthesia: the lesion was anesthetized in a standard fashion   Anesthetic:  1% lidocaine w/ epinephrine 1-100,000 local infiltration Instrument used: flexible razor blade   Outcome: patient tolerated procedure well   Post-procedure details: wound care instructions given    Destruction of lesion Complexity: simple   Destruction method: electrodesiccation and curettage   Informed consent: discussed and consent obtained   Timeout:  patient name, date of birth, surgical site, and procedure verified Anesthesia: the lesion was anesthetized in a standard fashion   Anesthetic:  1% lidocaine w/ epinephrine 1-100,000 local infiltration Curettage performed in three different directions: Yes   Curettage cycles:  1 Margin per side (cm):  0.1 Final wound size (cm):  1 Hemostasis achieved with:  aluminum chloride Outcome: patient tolerated procedure well with no complications   Post-procedure details: sterile  dressing applied and wound care instructions given   Dressing type: bandage and petrolatum    Specimen 3 - Surgical pathology Differential Diagnosis: R/O BCC vs SCC - treated after biopsy  Check Margins: No  Actinic keratosis Right Tip of Nose  Skin / nail biopsy - Right Tip of Nose Type of biopsy: tangential   Informed consent: discussed and consent obtained   Timeout: patient name, date of birth, surgical site, and procedure verified   Procedure prep:  Patient was prepped and draped in usual sterile fashion (Non sterile) Prep type:  Chlorhexidine Anesthesia: the lesion was anesthetized in a standard fashion   Anesthetic:  1% lidocaine w/ epinephrine 1-100,000 local infiltration Instrument used: flexible razor blade   Hemostasis achieved with: aluminum chloride   Outcome: patient tolerated procedure well   Post-procedure details: sterile dressing applied and wound care instructions given   Dressing type: bandage and petrolatum    Destruction of lesion - Right Tip of Nose Complexity: simple   Destruction method: electrodesiccation and curettage   Informed consent: discussed and consent obtained   Timeout:  patient name, date of birth, surgical site, and procedure verified Anesthesia: the lesion was anesthetized in a standard fashion   Anesthetic:  1% lidocaine w/ epinephrine 1-100,000 local infiltration Curettage performed in three different directions: Yes   Curettage cycles:  1 Margin per side (cm):  0.1 Final wound size (  cm):  1 Hemostasis achieved with:  aluminum chloride Outcome: patient tolerated procedure well with no complications   Post-procedure details: sterile dressing applied and wound care instructions given   Dressing type: bandage and petrolatum    Specimen 4 - Surgical pathology Differential Diagnosis: R/O BCC vs SCC - treated after biopsy  Check Margins: No  Lentigines - Scattered tan macules - Discussed due to sun exposure - Benign, observe - Call  for any changes  Seborrheic Keratoses - Stuck-on, waxy, tan-brown papules and plaques  - Discussed benign etiology and prognosis. - Observe - Call for any changes  Melanocytic Nevi - Tan-brown and/or pink-flesh-colored symmetric macules and papules - Benign appearing on exam today - Observation - Call clinic for new or changing moles - Recommend daily use of broad spectrum spf 30+ sunscreen to sun-exposed areas.   Hemangiomas - Red papules - Discussed benign nature - Observe - Call for any changes  Actinic Damage- significant. Tolak cream in the fall for prevention. - diffuse scaly erythematous macules with underlying dyspigmentation - Recommend daily broad spectrum sunscreen SPF 30+ to sun-exposed areas, reapply every 2 hours as needed.  - Call for new or changing lesions.  Skin cancer screening performed today.   I, Tomeca Helm, PA-C, have reviewed all documentation's for this visit.  The documentation on 09/25/20 for the exam, diagnosis, procedures and orders are all accurate and complete.

## 2020-09-10 NOTE — Patient Instructions (Signed)

## 2020-10-05 IMAGING — US ULTRASOUND ABDOMEN LIMITED
1 series · 13 of 25 positions shown · non-contrast
Comparison: 10/16/2018

CLINICAL DATA: Elevated liver function tests

EXAM:
ULTRASOUND ABDOMEN LIMITED RIGHT UPPER QUADRANT

[Series 1: ultrasound abdomen limited · 13 of 43 slices shown]
[im 1/43]
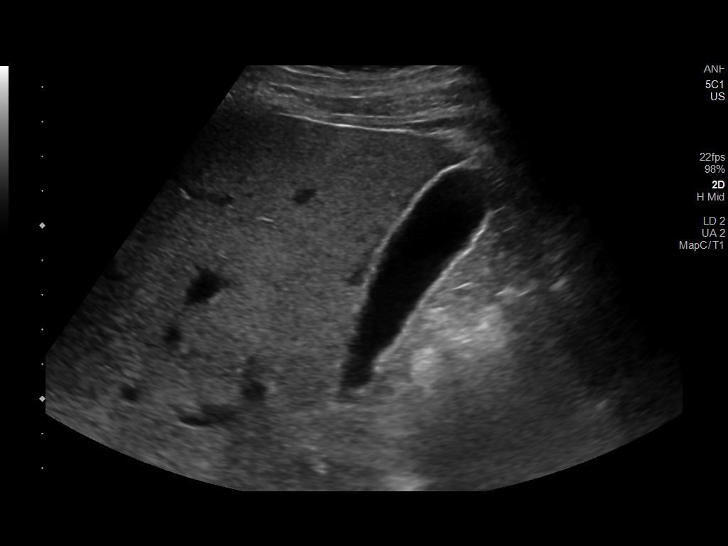
[im 4/43]
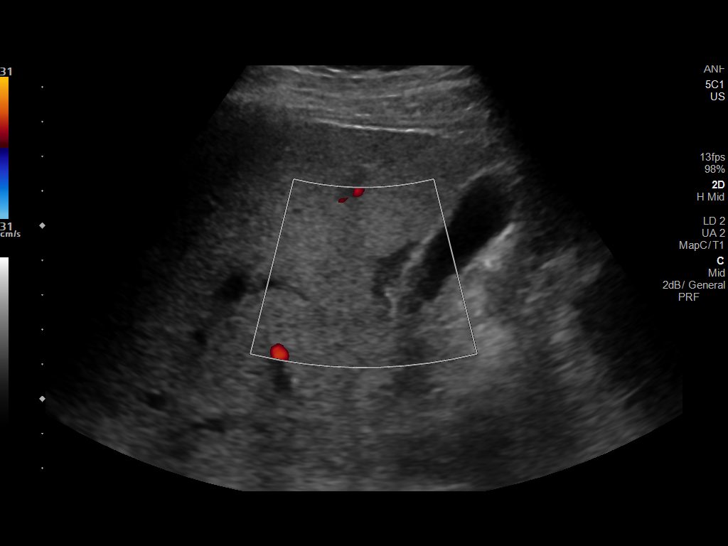
[im 8/43]
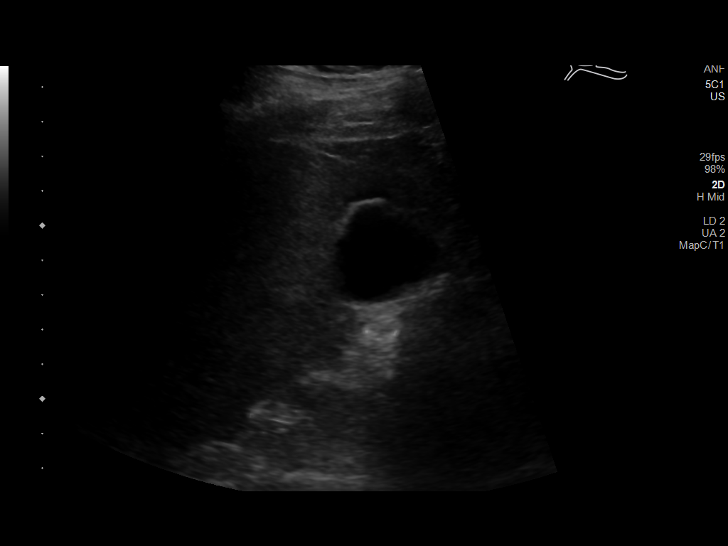
[im 11/43]
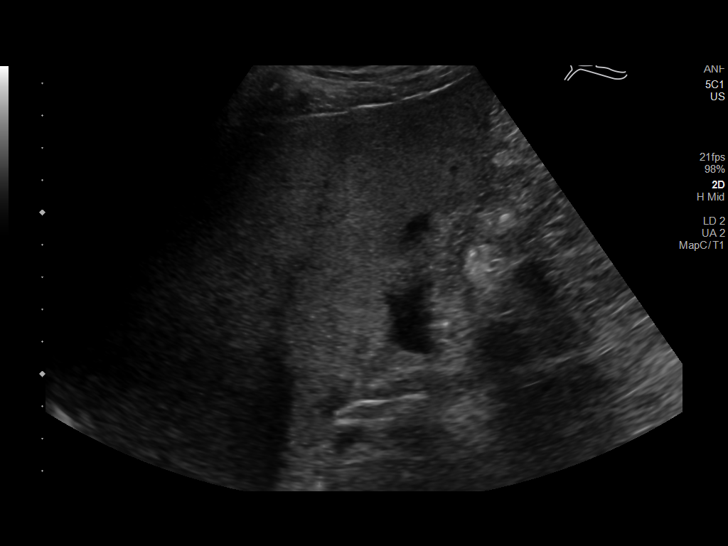
[im 15/43]
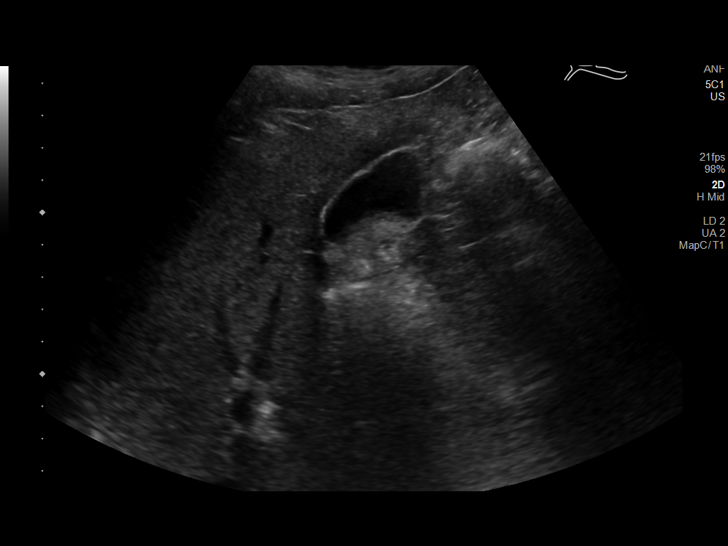
[im 18/43]
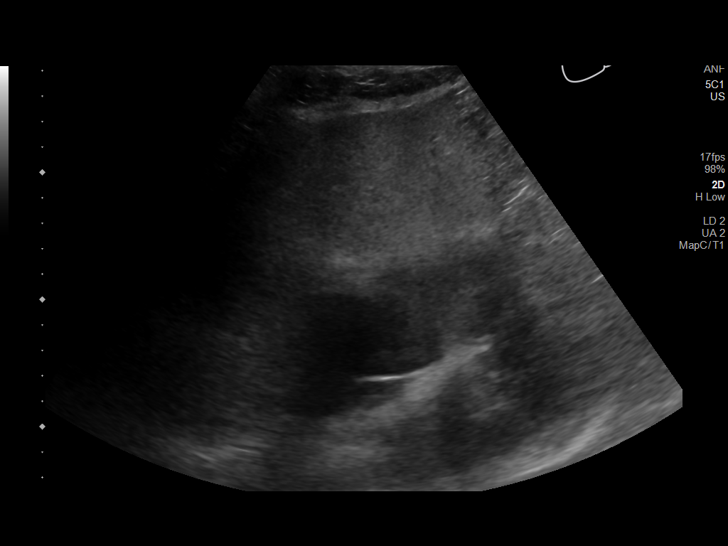
[im 22/43]
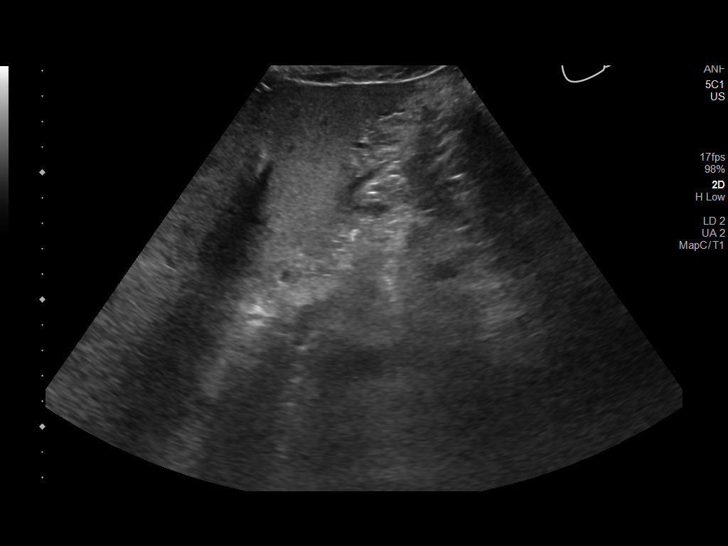
[im 25/43]
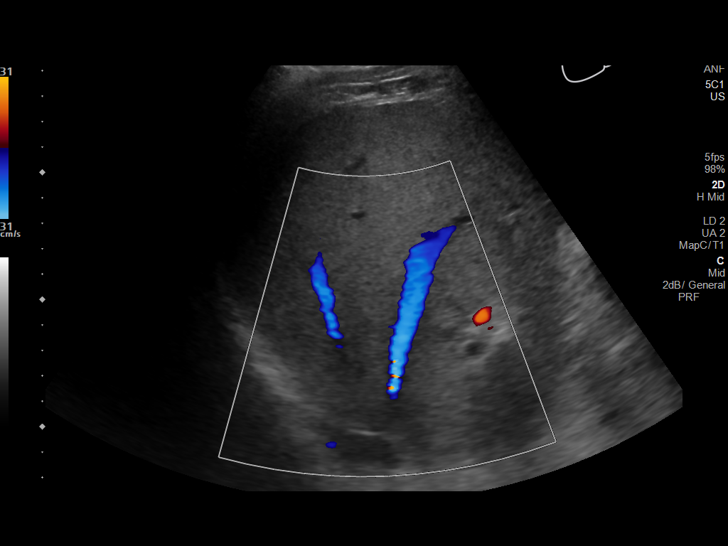
[im 29/43]
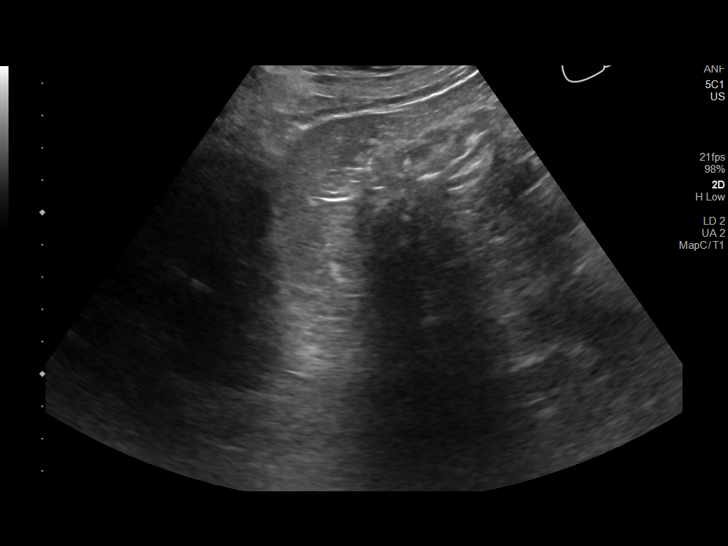
[im 32/43]
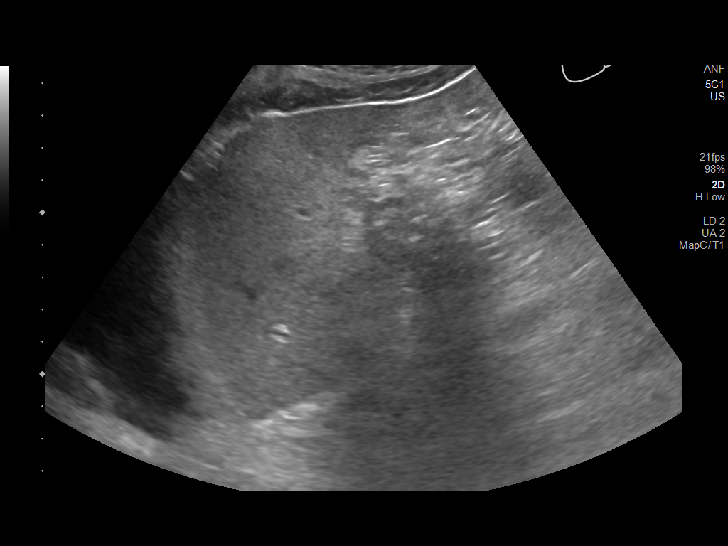
[im 36/43]
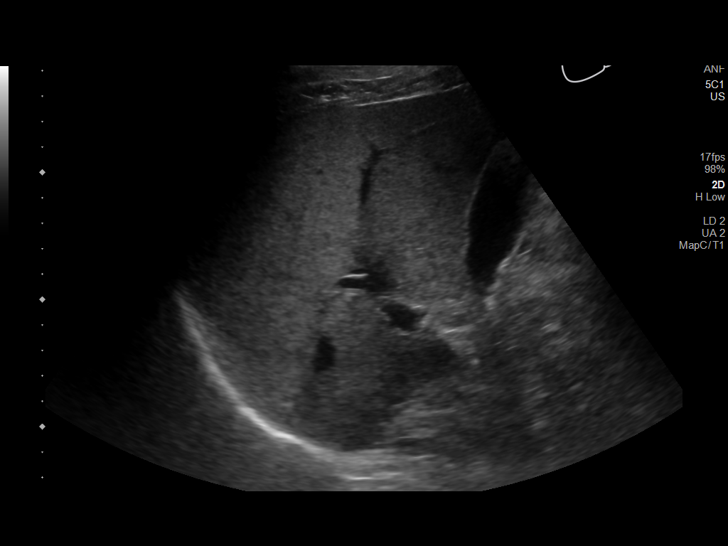
[im 39/43]
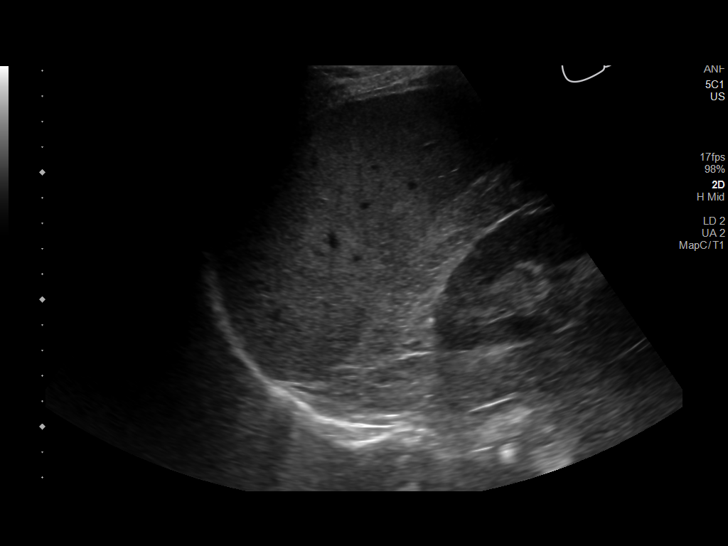
[im 43/43]
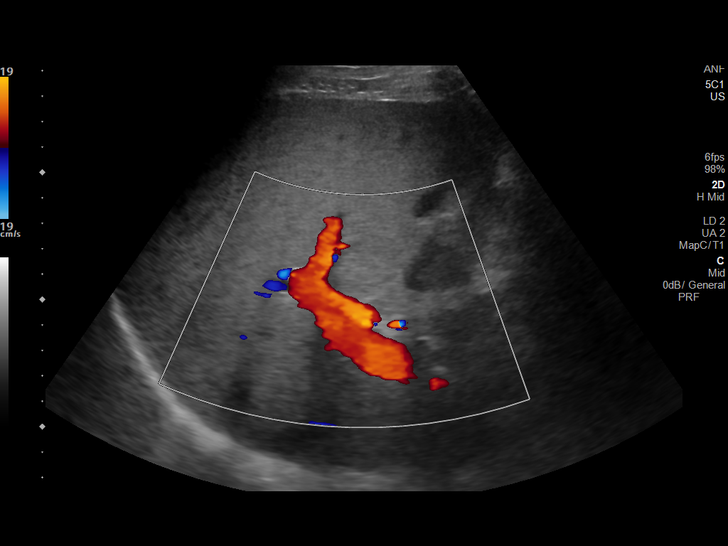

[13 of 25 positions shown; findings below may reference images not displayed]

FINDINGS: Gallbladder:

No gallstones or wall thickening visualized. No sonographic Murphy
sign noted by sonographer. There is a small hypoechogenicity between
the wall of the gallbladder and the liver, probably representing
adipose tissue interposed between the 2 given the triangular-shaped.
I am skeptical that this is liver lesion. A trace amount of complex
fluid adjacent to the gallbladder is conceivably a differential
diagnostic consideration. There is no observable flow. No lesion is
visible in this vicinity on the CT exam performed immediately prior
to the ultrasound.

Common bile duct:

Diameter: 3 mm

Liver:

No focal lesion identified. Within normal limits in parenchymal
echogenicity. Portal vein is patent on color Doppler imaging with
normal direction of blood flow towards the liver.
IMPRESSION: 1. A small triangular hypoechogenicity between the liver and
gallbladder is probably from interposed adipose tissue given that
there is no visible lesion or fluid in this vicinity on the CT scan
performed same day. No gallbladder wall thickening or
pericholecystic fluid.

## 2020-11-21 DIAGNOSIS — C678 Malignant neoplasm of overlapping sites of bladder: Secondary | ICD-10-CM | POA: Diagnosis not present

## 2020-12-17 ENCOUNTER — Other Ambulatory Visit: Payer: Self-pay

## 2020-12-17 ENCOUNTER — Ambulatory Visit (INDEPENDENT_AMBULATORY_CARE_PROVIDER_SITE_OTHER): Payer: PPO | Admitting: Physician Assistant

## 2020-12-17 ENCOUNTER — Encounter: Payer: Self-pay | Admitting: Physician Assistant

## 2020-12-17 DIAGNOSIS — D0439 Carcinoma in situ of skin of other parts of face: Secondary | ICD-10-CM

## 2020-12-17 DIAGNOSIS — Z1283 Encounter for screening for malignant neoplasm of skin: Secondary | ICD-10-CM | POA: Diagnosis not present

## 2020-12-17 DIAGNOSIS — D0430 Carcinoma in situ of skin of unspecified part of face: Secondary | ICD-10-CM

## 2020-12-17 DIAGNOSIS — L570 Actinic keratosis: Secondary | ICD-10-CM | POA: Diagnosis not present

## 2020-12-17 DIAGNOSIS — D485 Neoplasm of uncertain behavior of skin: Secondary | ICD-10-CM

## 2020-12-17 DIAGNOSIS — C4441 Basal cell carcinoma of skin of scalp and neck: Secondary | ICD-10-CM | POA: Diagnosis not present

## 2020-12-17 MED ORDER — TOLAK 4 % EX CREA: 1.0000 "application " | TOPICAL_CREAM | Freq: Every day | CUTANEOUS | 1 refills | Status: AC

## 2020-12-17 NOTE — Progress Notes (Signed)
Follow-Up Visit   Subjective  [name] [name] is a 70 y.o. male who presents for the following: Follow-up (Patient here for 3 month follow up. No concerns.).   The following portions of the chart were reviewed this encounter and updated as appropriate:  Tobacco  Allergies  Meds  Problems  Med Hx  Surg Hx  Fam Hx      Objective  Well appearing patient in no apparent distress; mood and affect are within normal limits.  All skin waist up examined.  Dorsum of Nose, Head - Anterior (Face) (5) Erythematous patches with gritty scale.  Neck - Posterior Pink macule with slight scale and a horn like crust on one edge       Right Forehead Hyperkeratotic scale with pink base        Assessment & Plan  AK (actinic keratosis) (6) Head - Anterior (Face) (5); Dorsum of Nose  Destruction of lesion - Dorsum of Nose, Head - Anterior (Face) Complexity: simple   Destruction method: cryotherapy   Informed consent: discussed and consent obtained   Timeout:  patient name, date of birth, surgical site, and procedure verified Lesion destroyed using liquid nitrogen: Yes   Cryotherapy cycles:  1 Outcome: patient tolerated procedure well with no complications   Post-procedure details: wound care instructions given    Fluorouracil (TOLAK) 4 % CREA - Dorsum of Nose, Head - Anterior (Face) Apply 1 application topically at bedtime.  BCC (basal cell carcinoma), scalp/neck Neck - Posterior  Epidermal / dermal shaving  Informed consent: discussed and consent obtained   Timeout: patient name, date of birth, surgical site, and procedure verified   Procedure prep:  Patient was prepped and draped in usual sterile fashion Prep type:  Chlorhexidine Anesthesia: the lesion was anesthetized in a standard fashion   Anesthetic:  1% lidocaine w/ epinephrine 1-100,000 local infiltration Instrument used: flexible razor blade   Hemostasis achieved with: aluminum chloride   Outcome: patient  tolerated procedure well   Post-procedure details: sterile dressing applied and wound care instructions given   Dressing type: petrolatum and bandage    Specimen 1 - Surgical pathology Differential Diagnosis: SCC vs BCC  Check Margins: No  Carcinoma in situ of skin of face, unspecified location Right Forehead  Skin / nail biopsy Type of biopsy: tangential   Informed consent: discussed and consent obtained   Timeout: patient name, date of birth, surgical site, and procedure verified   Anesthesia: the lesion was anesthetized in a standard fashion   Anesthetic:  1% lidocaine w/ epinephrine 1-100,000 local infiltration Instrument used: flexible razor blade   Hemostasis achieved with: ferric subsulfate   Outcome: patient tolerated procedure well   Post-procedure details: wound care instructions given    Destruction of lesion Complexity: simple   Destruction method: electrodesiccation and curettage   Informed consent: discussed and consent obtained   Timeout:  patient name, date of birth, surgical site, and procedure verified Anesthesia: the lesion was anesthetized in a standard fashion   Anesthetic:  1% lidocaine w/ epinephrine 1-100,000 local infiltration Curettage performed in three different directions: Yes   Curettage cycles:  3 Margin per side (cm):  0.1 Final wound size (cm):  2 Hemostasis achieved with:  aluminum chloride Outcome: patient tolerated procedure well with no complications   Post-procedure details: wound care instructions given    Specimen 2 - Surgical pathology Differential Diagnosis: SCC vs BCC  Check Margins: No   I, Deadrian Toya, PA-C, have reviewed all documentation's for this visit.  The documentation on 12/24/20 for the exam, diagnosis, procedures and orders are all accurate and complete.

## 2020-12-24 ENCOUNTER — Telehealth: Payer: Self-pay | Admitting: *Deleted

## 2020-12-24 NOTE — Telephone Encounter (Signed)
Path to patient. Patient has follow up.

## 2020-12-24 NOTE — Telephone Encounter (Signed)
-----   Message from Warren Danes, [place] sent at 12/24/2020  8:56 AM EDT ----- Recheck 6-9 months

## 2021-01-12 DIAGNOSIS — Z23 Encounter for immunization: Secondary | ICD-10-CM | POA: Diagnosis not present

## 2021-01-12 DIAGNOSIS — E039 Hypothyroidism, unspecified: Secondary | ICD-10-CM | POA: Diagnosis not present

## 2021-01-12 DIAGNOSIS — E119 Type 2 diabetes mellitus without complications: Secondary | ICD-10-CM | POA: Diagnosis not present

## 2021-01-12 DIAGNOSIS — E782 Mixed hyperlipidemia: Secondary | ICD-10-CM | POA: Diagnosis not present

## 2021-01-12 DIAGNOSIS — Z0000 Encounter for general adult medical examination without abnormal findings: Secondary | ICD-10-CM | POA: Diagnosis not present

## 2021-01-12 DIAGNOSIS — G8929 Other chronic pain: Secondary | ICD-10-CM | POA: Diagnosis not present

## 2021-01-12 DIAGNOSIS — M5442 Lumbago with sciatica, left side: Secondary | ICD-10-CM | POA: Diagnosis not present

## 2021-01-12 DIAGNOSIS — J019 Acute sinusitis, unspecified: Secondary | ICD-10-CM | POA: Diagnosis not present

## 2021-01-12 DIAGNOSIS — I10 Essential (primary) hypertension: Secondary | ICD-10-CM | POA: Diagnosis not present

## 2021-01-12 DIAGNOSIS — Z87891 Personal history of nicotine dependence: Secondary | ICD-10-CM | POA: Diagnosis not present

## 2021-02-09 DIAGNOSIS — M5416 Radiculopathy, lumbar region: Secondary | ICD-10-CM | POA: Diagnosis not present

## 2021-02-21 DIAGNOSIS — G894 Chronic pain syndrome: Secondary | ICD-10-CM | POA: Diagnosis not present

## 2021-03-20 DIAGNOSIS — H04123 Dry eye syndrome of bilateral lacrimal glands: Secondary | ICD-10-CM | POA: Diagnosis not present

## 2021-03-20 DIAGNOSIS — H40033 Anatomical narrow angle, bilateral: Secondary | ICD-10-CM | POA: Diagnosis not present

## 2021-04-08 ENCOUNTER — Ambulatory Visit: Payer: PPO | Admitting: Physician Assistant

## 2021-04-08 ENCOUNTER — Other Ambulatory Visit: Payer: Self-pay

## 2021-04-08 ENCOUNTER — Encounter: Payer: Self-pay | Admitting: Physician Assistant

## 2021-04-08 DIAGNOSIS — D489 Neoplasm of uncertain behavior, unspecified: Secondary | ICD-10-CM

## 2021-04-08 DIAGNOSIS — L820 Inflamed seborrheic keratosis: Secondary | ICD-10-CM | POA: Diagnosis not present

## 2021-04-08 DIAGNOSIS — C4441 Basal cell carcinoma of skin of scalp and neck: Secondary | ICD-10-CM

## 2021-04-08 DIAGNOSIS — Z85828 Personal history of other malignant neoplasm of skin: Secondary | ICD-10-CM

## 2021-04-08 DIAGNOSIS — C44519 Basal cell carcinoma of skin of other part of trunk: Secondary | ICD-10-CM | POA: Diagnosis not present

## 2021-04-08 DIAGNOSIS — C4491 Basal cell carcinoma of skin, unspecified: Secondary | ICD-10-CM

## 2021-04-08 HISTORY — DX: Basal cell carcinoma of skin, unspecified: C44.91

## 2021-04-08 NOTE — Patient Instructions (Signed)

## 2021-04-08 NOTE — Progress Notes (Addendum)
° °  Follow-Up Visit   Subjective  [name] [name] is a 70 y.o. male who presents for the following: Skin Problem (Lesion on back neck x 2 months -crusty. Personal history non mole skin cancers. ).   The following portions of the chart were reviewed this encounter and updated as appropriate:  Tobacco   Allergies   Meds   Problems   Med Hx   Surg Hx   Fam Hx       Objective  Well appearing patient in no apparent distress; mood and affect are within normal limits.  All skin waist up examined.  Left Malar Cheek, Right Malar Cheek White plaque  Neck - Posterior Pearly plaque- recurrent       Mid Back Hyperkeratotic scale with pink base         Assessment & Plan  Seborrheic keratosis, inflamed (2) Left Malar Cheek; Right Malar Cheek  Destruction of lesion - Left Malar Cheek, Right Malar Cheek Complexity: simple   Destruction method: cryotherapy   Informed consent: discussed and consent obtained   Timeout:  patient name, date of birth, surgical site, and procedure verified Lesion destroyed using liquid nitrogen: Yes   Lesion length (cm):  3 Outcome: patient tolerated procedure well with no complications    Neoplasm of uncertain behavior (2) Neck - Posterior  Skin / nail biopsy Type of biopsy: tangential   Informed consent: discussed and consent obtained   Timeout: patient name, date of birth, surgical site, and procedure verified   Procedure prep:  Patient was prepped and draped in usual sterile fashion (Non sterile) Prep type:  Chlorhexidine Anesthesia: the lesion was anesthetized in a standard fashion   Anesthetic:  1% lidocaine w/ epinephrine 1-100,000 local infiltration Instrument used: flexible razor blade   Outcome: patient tolerated procedure well   Post-procedure details: wound care instructions given    Specimen 1 - Surgical pathology Differential Diagnosis: bcc vs scc  Check Margins: yes  Mid Back  Skin / nail biopsy Type of biopsy: tangential    Informed consent: discussed and consent obtained   Timeout: patient name, date of birth, surgical site, and procedure verified   Procedure prep:  Patient was prepped and draped in usual sterile fashion (Non sterile) Prep type:  Chlorhexidine Anesthesia: the lesion was anesthetized in a standard fashion   Anesthetic:  1% lidocaine w/ epinephrine 1-100,000 local infiltration Instrument used: flexible razor blade   Outcome: patient tolerated procedure well   Post-procedure details: wound care instructions given    Specimen 2 - Surgical pathology Differential Diagnosis: bcc vs scc  Check Margins: yes    I, Derell Bruun, PA-C, have reviewed all documentation's for this visit.  The documentation on 04/23/21 for the exam, diagnosis, procedures and orders are all accurate and complete.

## 2021-04-28 ENCOUNTER — Ambulatory Visit: Payer: Self-pay | Admitting: Orthopedic Surgery

## 2021-04-28 DIAGNOSIS — Z01818 Encounter for other preprocedural examination: Secondary | ICD-10-CM

## 2021-05-04 NOTE — Progress Notes (Signed)
Surgical Instructions    Your procedure is scheduled on Thursday, January 19th.  Report to [facility] Main Entrance "A" at 10:45 A.M., then check in with the Admitting office.  Call this number if you have problems the morning of surgery:  [phone]   If you have any questions prior to your surgery date call [phone]: Open Monday-Friday 8am-4pm    Remember:  Do not eat after midnight the night before your surgery  You may drink clear liquids until 9:45 AM the morning of your surgery.   Clear liquids allowed are: Water, Non-Citrus Juices (without pulp), Carbonated Beverages, Clear Tea, Black Coffee ONLY (NO MILK, CREAM OR POWDERED CREAMER of any kind), and Gatorade    Take these medicines the morning of surgery with A SIP OF WATER  Atorvastatin (Lipitor)  Levothyroxine (Synthroid)  If needed: Zyrtec Flonase Nasal Spray Gabapentin (Neurontin) Tizanidine (Zanaflex) Eye drops    Follow your surgeon's instructions on when to stop Aspirin.  If no instructions were given by your surgeon then you will need to call the office to get those instructions.    As of today, STOP taking any Aleve, Naproxen, Ibuprofen, Motrin, Advil, Goody's, BC's, all herbal medications, fish oil, and all vitamins.   DAY OF SURGERY:         Do not wear jewelry  Do not wear lotions, powders, colognes, or deodorant. Men may shave face and neck. Do not bring valuables to the hospital.             [facility] is not responsible for any belongings or valuables.  Do NOT Smoke (Tobacco/Vaping)  24 hours prior to your procedure  If you use a CPAP at night, you may bring your mask for your overnight stay.   Contacts, glasses, hearing aids, dentures or partials may not be worn into surgery, please bring cases for these belongings   For patients admitted to the hospital, discharge time will be determined by your treatment team.   Patients discharged the day of surgery will not be allowed to drive home,  and someone needs to stay with them for 24 hours.  NO VISITORS WILL BE ALLOWED IN PRE-OP WHERE PATIENTS ARE PREPPED FOR SURGERY.  ONLY 1 SUPPORT PERSON MAY BE PRESENT IN THE WAITING ROOM WHILE YOU ARE IN SURGERY.  IF YOU ARE TO BE ADMITTED, ONCE YOU ARE IN YOUR ROOM YOU WILL BE ALLOWED TWO (2) VISITORS. 1 (ONE) VISITOR MAY STAY OVERNIGHT BUT MUST ARRIVE TO THE ROOM BY 8pm.  Minor children may have two parents present. Special consideration for safety and communication needs will be reviewed on a case by case basis.  Special instructions:    Oral Hygiene is also important to reduce your risk of infection.  Remember - BRUSH YOUR TEETH THE MORNING OF SURGERY WITH YOUR REGULAR TOOTHPASTE   [facility]- Preparing For Surgery  Before surgery, you can play an important role. Because skin is not sterile, your skin needs to be as free of germs as possible. You can reduce the number of germs on your skin by washing with CHG (chlorahexidine gluconate) Soap before surgery.  CHG is an antiseptic cleaner which kills germs and bonds with the skin to continue killing germs even after washing.     Please do not use if you have an allergy to CHG or antibacterial soaps. If your skin becomes reddened/irritated stop using the CHG.  Do not shave (including legs and underarms) for at least 48 hours prior to first  CHG shower. It is OK to shave your face.  Please follow these instructions carefully.     Shower the NIGHT BEFORE SURGERY and the MORNING OF SURGERY with CHG Soap.   If you chose to wash your hair, wash your hair first as usual with your normal shampoo. After you shampoo, rinse your hair and body thoroughly to remove the shampoo.  Then [employer] and genitals (private parts) with your normal soap and rinse thoroughly to remove soap.  After that Use CHG Soap as you would any other liquid soap. You can apply CHG directly to the skin and wash gently with a scrungie or a clean washcloth.   Apply the CHG Soap  to your body ONLY FROM THE NECK DOWN.  Do not use on open wounds or open sores. Avoid contact with your eyes, ears, mouth and genitals (private parts). Wash Face and genitals (private parts)  with your normal soap.   Wash thoroughly, paying special attention to the area where your surgery will be performed.  Thoroughly rinse your body with warm water from the neck down.  DO NOT shower/wash with your normal soap after using and rinsing off the CHG Soap.  Pat yourself dry with a CLEAN TOWEL.  Wear CLEAN PAJAMAS to bed the night before surgery  Place CLEAN SHEETS on your bed the night before your surgery  DO NOT SLEEP WITH PETS.   Day of Surgery:  Take a shower with CHG soap. Wear Clean/Comfortable clothing the morning of surgery Do not apply any deodorants/lotions.   Remember to brush your teeth WITH YOUR REGULAR TOOTHPASTE.   Please read over the following fact sheets that you were given.

## 2021-05-05 ENCOUNTER — Other Ambulatory Visit: Payer: Self-pay

## 2021-05-05 ENCOUNTER — Encounter (HOSPITAL_COMMUNITY)
Admission: RE | Admit: 2021-05-05 | Discharge: 2021-05-05 | Disposition: A | Discharge status: Home / Self Care | Payer: PPO | Source: Ambulatory Visit | Attending: Orthopedic Surgery | Admitting: Orthopedic Surgery

## 2021-05-05 ENCOUNTER — Encounter (HOSPITAL_COMMUNITY): Payer: Self-pay

## 2021-05-05 DIAGNOSIS — Z01818 Encounter for other preprocedural examination: Secondary | ICD-10-CM | POA: Diagnosis present

## 2021-05-05 DIAGNOSIS — Z20822 Contact with and (suspected) exposure to covid-19: Secondary | ICD-10-CM | POA: Insufficient documentation

## 2021-05-05 HISTORY — DX: Prediabetes: R73.03

## 2021-05-05 LAB — URINALYSIS, ROUTINE W REFLEX MICROSCOPIC
Bilirubin Urine: NEGATIVE
Glucose, UA: NEGATIVE mg/dL
Hgb urine dipstick: NEGATIVE
Ketones, ur: NEGATIVE mg/dL
Leukocytes,Ua: NEGATIVE
Nitrite: NEGATIVE
Protein, ur: NEGATIVE mg/dL
Specific Gravity, Urine: >1.03 — ABNORMAL HIGH (ref 1.005–1.030)
pH: 5.5 (ref 5.0–8.0)

## 2021-05-05 LAB — BASIC METABOLIC PANEL
Anion gap: 8 (ref 5–15)
BUN: 13 mg/dL (ref 8–23)
CO2: 26 mmol/L (ref 22–32)
Calcium: 9.8 mg/dL (ref 8.9–10.3)
Chloride: 105 mmol/L (ref 98–111)
Creatinine, Ser: 1.02 mg/dL (ref 0.61–1.24)
GFR, Estimated: >60 mL/min (ref >60)
Glucose, Bld: 150 mg/dL — ABNORMAL HIGH (ref 70–99)
Potassium: 4.1 mmol/L (ref 3.5–5.1)
Sodium: 139 mmol/L (ref 135–145)

## 2021-05-05 LAB — CBC
HCT: 43.7 % (ref 39.0–52.0)
Hemoglobin: 14.7 g/dL (ref 13.0–17.0)
MCH: 29.3 pg (ref 26.0–34.0)
MCHC: 33.6 g/dL (ref 30.0–36.0)
MCV: 87.2 fL (ref 80.0–100.0)
Platelets: 241 10*3/uL (ref 150–400)
RBC: 5.01 MIL/uL (ref 4.22–5.81)
RDW: 13.3 % (ref 11.5–15.5)
WBC: 8.1 10*3/uL (ref 4.0–10.5)
nRBC: 0 % (ref 0.0–0.2)

## 2021-05-05 LAB — SURGICAL PCR SCREEN
MRSA, PCR: NEGATIVE
Staphylococcus aureus: NEGATIVE

## 2021-05-05 LAB — SARS CORONAVIRUS 2 (TAT 6-24 HRS): SARS Coronavirus 2: NEGATIVE

## 2021-05-05 NOTE — Progress Notes (Signed)
PCP - Clyde Lundborg Cardiologist - denies  Chest x-ray - n/a EKG - 05/05/21 ECHO - 2015  Aspirin Instructions: last dose 05/05/21, per patient  ERAS Protcol - yes, no drink ordered or given   COVID TEST- 05/05/21   Anesthesia review: yes, per Dr. Valla Leaver order  Patient denies shortness of breath, fever, cough and chest pain at PAT appointment   All instructions explained to the patient, with a verbal understanding of the material. Patient agrees to go over the instructions while at home for a better understanding. Patient also instructed to self quarantine after being tested for COVID-19. The opportunity to ask questions was provided.

## 2021-05-06 ENCOUNTER — Ambulatory Visit: Payer: Self-pay | Admitting: Orthopedic Surgery

## 2021-05-06 NOTE — H&P (Deleted)
  The note originally documented on this encounter has been moved the the encounter in which it belongs.  

## 2021-05-06 NOTE — H&P (Signed)
Subjective:   Location: left leg & back Quality: aching; burning Severity: pain level 0/10 Duration: 8 months Context: cannot identify Associated Symptoms: radiation down leg Prior Imaging: MRI Previous Injections: did not help MRI Results/Positive SCS Trial  Patient Active Problem List   Diagnosis Date Noted   SCC (squamous cell carcinoma) 51/76/1607   Umbilical hernia, incarcerated 02/12/2014   Preoperative cardiovascular examination 01/22/2014   Murmur, cardiac 01/22/2014   Elevated blood pressure 01/22/2014   Past Medical History:  Diagnosis Date   Chronic back pain    CIS (carcinoma in situ) 06/14/2014   left temple-txpbx   CIS (carcinoma in situ) 04/28/2016   left crown of scalp-txpbx   Erectile dysfunction    Heart murmur    History of colonic polyps    History of kidney stones    Hypertension 10/1925   Hypothyroidism    Internal hemorrhoids    Osteoarthritis    Pre-diabetes    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   right neck-CX35FU+imiquimod   SCC (squamous cell carcinoma) 03/20/2010   Left jawline-CX35FU   SCC (squamous cell carcinoma) 11/28/2012   right cheek-Cx35FU, cautery and 5FU   SCC (squamous cell carcinoma) 01/20/2016   left inner ear-Cx16fu   SCC (squamous cell carcinoma) 01/20/2016   left cheek CX62fu   SCC (squamous cell carcinoma) 02/15/2018   left forehead-txpbx   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 all CX35FU   Seasonal allergies     Past Surgical History:  Procedure Laterality Date   CARPAL TUNNEL RELEASE Bilateral    CYSTOSCOPY W/ RETROGRADES Left 11/22/2018   Procedure: CYSTOSCOPY WITH BILATERAL RETROGRADE PYELOGRAM;  Surgeon: Lucas Mallow, MD;  Location: [facility];  Service: Urology;  Laterality: Left;    ESL     HERNIA REPAIR     umbilical   KNEE ARTHROPLASTY Right    KNEE ARTHROSCOPY Bilateral    NASAL SEPTUM SURGERY     TRANSURETHRAL RESECTION OF BLADDER TUMOR WITH MITOMYCIN-C N/A 11/22/2018   Procedure: CYSTOSCOPY, TRANSURETHRAL RESECTION OF BLADDER TUMOR;  Surgeon: Lucas Mallow, MD;  Location: [facility];  Service: Urology;  Laterality: N/A;   UMBILICAL HERNIA REPAIR N/A 02/12/2014   Procedure: HERNIA REPAIR UMBILICAL ADULT;  Surgeon: Scherry Ran, MD;  Location: [facility];  Service: General;  Laterality: N/A;    Current Outpatient Medications  Medication Sig Dispense Refill Last Dose   aspirin EC 81 MG tablet Take 81 mg by mouth daily.      atorvastatin (LIPITOR) 20 MG tablet Take 20 mg by mouth daily.      B Complex Vitamins (VITAMIN B COMPLEX PO) Take 1 tablet by mouth daily.      Bioflavonoid Products (ESTER C PO) Take 1 tablet by mouth daily.      cetirizine (ZYRTEC) 10 MG tablet Take 10 mg by mouth daily.      Cholecalciferol (DIALYVITE VITAMIN D 5000) 125 MCG (5000 UT) capsule Take 5,000 Units by mouth daily.      Fluorouracil (TOLAK) 4 % CREA Apply 1 application topically at bedtime. 40 g 1    fluticasone (FLONASE) 50 MCG/ACT nasal spray Place 1-2 sprays into both nostrils daily as needed for allergies or rhinitis.      gabapentin (NEURONTIN) 100 MG capsule Take 100 mg by mouth every 8 (eight) hours as needed (pain).  levothyroxine (SYNTHROID) 88 MCG tablet Take 88 mcg by mouth daily before breakfast.      losartan (COZAAR) 25 MG tablet Take 25 mg by mouth daily.      Polyvinyl Alcohol-Povidone (REFRESH OP) Place 1 drop into both eyes 2 (two) times daily as needed (dry eyes).      tiZANidine (ZANAFLEX) 2 MG tablet Take 2 mg by mouth every 8 (eight) hours as needed for muscle spasms.      No current facility-administered medications for this visit.   No Known Allergies  Social History   Tobacco Use   Smoking status: Former    Packs/day: 1.00    Years: 25.00     Pack years: 25.00    Types: Cigarettes    Start date: 04/24/1958    Quit date: 04/24/1980    Years since quitting: 41.0   Smokeless tobacco: Former    Types: Chew  Substance Use Topics   Alcohol use: Yes    Alcohol/week: 3.0 standard drinks    Types: 3 Cans of beer per week    Comment: Occasional    Family History  Problem Relation Age of Onset   Heart disease Father    Diabetes Mellitus II Father    Cervical cancer Mother     Review of Systems Pertinent items are noted in HPI.  Objective:   Clinical exam: [name] [name] is a pleasant individual, who appears younger than their stated age.  He is alert and orientated 3.  No shortness of breath, chest pain. Lungs: Clear to auscultation bilaterally. Cardiac: Regular rate and rhythm no rubs gallops or murmurs  Abdomen is soft and non-tender, no loss of bowel and bladder control, no rebound tenderness.  Negative: skin lesions abrasions contusions  Peripheral pulses: 2+ dorsalis pedis/posterior tibialis pulses bilaterally. Compartment soft and nontender.  Gait pattern: Normal  Assistive devices: None  Neuro: 5/5 motor strength in the lower extremity bilaterally. Positive intermittent dysesthesias and neuropathic pain predominantly into the left lower extremity. Negative Babinski test, no clonus, symmetrical 1+ deep tendon reflexes at the knee absent at the Achilles.  Musculoskeletal: Significant back pain radiating into the left gluteal region and left lower extremity. Pain with range of motion and deep palpation in the midline radiating into the paraspinal region. No SI joint pain.  Thoracic MRI completed on 04/26/2021 at [facility] was reviewed. I agree with the radiology report. 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 Mild central stenosis at T11-12. There is also mild central stenosis at T10-11 and slightly increased right foraminal stenosis. Mild  central stenosis at T9-10 and T8-9.  Assessment:   [name] [name] is a very pleasant 71 year old gentleman with chronic low back pain with radicular left leg pain. He has had physical therapy activity modification and medications as well as previous injections. Despite these conservative modalities his overall quality of life is continued to deteriorate. He recently had a spinal cord stimulator trial and noted significant improvement in his quality of life. As a result of the successful trial he presents today to discuss permanent implantation.    Plan:   I have gone over the surgical procedure and all of his questions were addressed. We reviewed the risks, benefits, and alternatives to surgery. Risks and benefits of surgery were discussed with the patient. These include: Infection, bleeding, death, stroke, paralysis, ongoing or worse pain, need for additional surgery, leak of spinal fluid, Failure of the battery requiring reoperation. Inability  to place the paddle requiring the surgery to be aborted. Migration of the lead, failure to obtain results similar to the trial.  Treatment plan: We will obtain preoperative medical clearance from his primary care physician and move forward with surgery in a timely fashion.Marland Kitchen

## 2021-05-07 ENCOUNTER — Ambulatory Visit (HOSPITAL_COMMUNITY): Payer: PPO | Admitting: Certified Registered Nurse Anesthetist

## 2021-05-07 ENCOUNTER — Other Ambulatory Visit: Payer: Self-pay

## 2021-05-07 ENCOUNTER — Encounter (HOSPITAL_COMMUNITY)
Admission: RE | Disposition: A | Discharge status: Home / Self Care | Payer: Self-pay | Source: Home / Self Care | Attending: Orthopedic Surgery

## 2021-05-07 ENCOUNTER — Ambulatory Visit (HOSPITAL_COMMUNITY): Payer: PPO | Admitting: Physician Assistant

## 2021-05-07 ENCOUNTER — Ambulatory Visit (HOSPITAL_COMMUNITY): Payer: PPO

## 2021-05-07 ENCOUNTER — Encounter (HOSPITAL_COMMUNITY): Payer: Self-pay | Admitting: Orthopedic Surgery

## 2021-05-07 ENCOUNTER — Observation Stay (HOSPITAL_COMMUNITY)
Admission: RE | Admit: 2021-05-07 | Discharge: 2021-05-08 | Disposition: A | Discharge status: Home / Self Care | Payer: PPO | Attending: Orthopedic Surgery | Admitting: Orthopedic Surgery

## 2021-05-07 DIAGNOSIS — Z419 Encounter for procedure for purposes other than remedying health state, unspecified: Secondary | ICD-10-CM

## 2021-05-07 DIAGNOSIS — Z7982 Long term (current) use of aspirin: Secondary | ICD-10-CM | POA: Diagnosis not present

## 2021-05-07 DIAGNOSIS — Z79899 Other long term (current) drug therapy: Secondary | ICD-10-CM | POA: Diagnosis not present

## 2021-05-07 DIAGNOSIS — G8929 Other chronic pain: Secondary | ICD-10-CM | POA: Diagnosis present

## 2021-05-07 DIAGNOSIS — R2689 Other abnormalities of gait and mobility: Secondary | ICD-10-CM | POA: Diagnosis not present

## 2021-05-07 DIAGNOSIS — I10 Essential (primary) hypertension: Secondary | ICD-10-CM | POA: Diagnosis not present

## 2021-05-07 DIAGNOSIS — Z85828 Personal history of other malignant neoplasm of skin: Secondary | ICD-10-CM | POA: Diagnosis not present

## 2021-05-07 DIAGNOSIS — E039 Hypothyroidism, unspecified: Secondary | ICD-10-CM | POA: Insufficient documentation

## 2021-05-07 DIAGNOSIS — Z87891 Personal history of nicotine dependence: Secondary | ICD-10-CM | POA: Insufficient documentation

## 2021-05-07 HISTORY — PX: SPINAL CORD STIMULATOR INSERTION: SHX5378

## 2021-05-07 SURGERY — INSERTION, SPINAL CORD STIMULATOR, LUMBAR
Anesthesia: General | Site: Back

## 2021-05-07 MED ORDER — ACETAMINOPHEN 325 MG PO TABS: 325.0000 mg | ORAL_TABLET | ORAL | Status: DC | PRN

## 2021-05-07 MED ORDER — HYDROMORPHONE HCL 1 MG/ML IJ SOLN: 1.0000 mg | INTRAMUSCULAR | Status: AC | PRN

## 2021-05-07 MED ORDER — PROPOFOL 10 MG/ML IV BOLUS
INTRAVENOUS | Status: DC | PRN
  Administered 2021-05-07: 150 mg via INTRAVENOUS

## 2021-05-07 MED ORDER — CEFAZOLIN SODIUM-DEXTROSE 2-4 GM/100ML-% IV SOLN
2.0000 g | INTRAVENOUS | Status: AC
  Administered 2021-05-07: 2 g via INTRAVENOUS
  Filled 2021-05-07: qty 100

## 2021-05-07 MED ORDER — FENTANYL CITRATE (PF) 250 MCG/5ML IJ SOLN
INTRAMUSCULAR | Status: DC | PRN
  Administered 2021-05-07: 100 ug via INTRAVENOUS
  Administered 2021-05-07: 50 ug via INTRAVENOUS

## 2021-05-07 MED ORDER — ATORVASTATIN CALCIUM 10 MG PO TABS: 20.0000 mg | ORAL_TABLET | Freq: Every day | ORAL | Status: DC

## 2021-05-07 MED ORDER — FENTANYL CITRATE (PF) 250 MCG/5ML IJ SOLN
INTRAMUSCULAR | Status: AC
  Filled 2021-05-07: qty 5

## 2021-05-07 MED ORDER — PHENYLEPHRINE 40 MCG/ML (10ML) SYRINGE FOR IV PUSH (FOR BLOOD PRESSURE SUPPORT)
PREFILLED_SYRINGE | INTRAVENOUS | Status: DC | PRN
  Administered 2021-05-07 (×6): 80 ug via INTRAVENOUS
  Administered 2021-05-07 (×2): 40 ug via INTRAVENOUS
  Administered 2021-05-07: 80 ug via INTRAVENOUS
  Administered 2021-05-07 (×2): 40 ug via INTRAVENOUS

## 2021-05-07 MED ORDER — CEFAZOLIN SODIUM-DEXTROSE 1-4 GM/50ML-% IV SOLN
1.0000 g | Freq: Three times a day (TID) | INTRAVENOUS | Status: AC
  Administered 2021-05-07 (×2): 1 g via INTRAVENOUS
  Filled 2021-05-07 (×2): qty 50

## 2021-05-07 MED ORDER — OXYCODONE HCL 5 MG PO TABS: 5.0000 mg | ORAL_TABLET | ORAL | Status: DC | PRN

## 2021-05-07 MED ORDER — SODIUM CHLORIDE 0.9% FLUSH
3.0000 mL | Freq: Two times a day (BID) | INTRAVENOUS | Status: DC
  Administered 2021-05-07: 3 mL via INTRAVENOUS

## 2021-05-07 MED ORDER — POLYETHYLENE GLYCOL 3350 17 G PO PACK: 17.0000 g | PACK | Freq: Every day | ORAL | Status: DC | PRN

## 2021-05-07 MED ORDER — THROMBIN 20000 UNITS EX SOLR
CUTANEOUS | Status: AC
  Filled 2021-05-07: qty 20000

## 2021-05-07 MED ORDER — TRANEXAMIC ACID-NACL 1000-0.7 MG/100ML-% IV SOLN
INTRAVENOUS | Status: AC
  Filled 2021-05-07: qty 100

## 2021-05-07 MED ORDER — FENTANYL CITRATE (PF) 100 MCG/2ML IJ SOLN: 25.0000 ug | INTRAMUSCULAR | Status: DC | PRN

## 2021-05-07 MED ORDER — BUPIVACAINE-EPINEPHRINE (PF) 0.25% -1:200000 IJ SOLN
INTRAMUSCULAR | Status: AC
  Filled 2021-05-07: qty 30

## 2021-05-07 MED ORDER — EPHEDRINE 5 MG/ML INJ
INTRAVENOUS | Status: AC
  Filled 2021-05-07: qty 5

## 2021-05-07 MED ORDER — DEXAMETHASONE SODIUM PHOSPHATE 10 MG/ML IJ SOLN
INTRAMUSCULAR | Status: AC
  Filled 2021-05-07: qty 1

## 2021-05-07 MED ORDER — SUGAMMADEX SODIUM 200 MG/2ML IV SOLN
INTRAVENOUS | Status: DC | PRN
  Administered 2021-05-07: 200 mg via INTRAVENOUS

## 2021-05-07 MED ORDER — FLUTICASONE PROPIONATE 50 MCG/ACT NA SUSP: 1.0000 | Freq: Every day | NASAL | Status: DC | PRN

## 2021-05-07 MED ORDER — MIDAZOLAM HCL 2 MG/2ML IJ SOLN
INTRAMUSCULAR | Status: AC
  Filled 2021-05-07: qty 2

## 2021-05-07 MED ORDER — SODIUM CHLORIDE 0.9 % IV SOLN
250.0000 mL | INTRAVENOUS | Status: DC
  Administered 2021-05-07: 250 mL via INTRAVENOUS

## 2021-05-07 MED ORDER — ACETAMINOPHEN 325 MG PO TABS
650.0000 mg | ORAL_TABLET | ORAL | Status: DC | PRN
  Administered 2021-05-07 (×2): 650 mg via ORAL
  Filled 2021-05-07 (×2): qty 2

## 2021-05-07 MED ORDER — ROCURONIUM BROMIDE 10 MG/ML (PF) SYRINGE
PREFILLED_SYRINGE | INTRAVENOUS | Status: AC
  Filled 2021-05-07: qty 10

## 2021-05-07 MED ORDER — PHENYLEPHRINE 40 MCG/ML (10ML) SYRINGE FOR IV PUSH (FOR BLOOD PRESSURE SUPPORT)
PREFILLED_SYRINGE | INTRAVENOUS | Status: AC
  Filled 2021-05-07: qty 10

## 2021-05-07 MED ORDER — BUPIVACAINE-EPINEPHRINE 0.25% -1:200000 IJ SOLN
INTRAMUSCULAR | Status: DC | PRN
  Administered 2021-05-07: 20 mL

## 2021-05-07 MED ORDER — ACETAMINOPHEN 10 MG/ML IV SOLN: 1000.0000 mg | Freq: Once | INTRAVENOUS | Status: DC | PRN

## 2021-05-07 MED ORDER — SODIUM CHLORIDE 0.9% FLUSH: 3.0000 mL | INTRAVENOUS | Status: DC | PRN

## 2021-05-07 MED ORDER — LEVOTHYROXINE SODIUM 88 MCG PO TABS
88.0000 ug | ORAL_TABLET | Freq: Every day | ORAL | Status: DC
  Administered 2021-05-08: 88 ug via ORAL
  Filled 2021-05-07: qty 1

## 2021-05-07 MED ORDER — GABAPENTIN 100 MG PO CAPS
100.0000 mg | ORAL_CAPSULE | Freq: Three times a day (TID) | ORAL | Status: DC | PRN
  Administered 2021-05-07: 100 mg via ORAL
  Filled 2021-05-07: qty 1

## 2021-05-07 MED ORDER — MIDAZOLAM HCL 2 MG/2ML IJ SOLN
INTRAMUSCULAR | Status: DC | PRN
  Administered 2021-05-07: 2 mg via INTRAVENOUS

## 2021-05-07 MED ORDER — ONDANSETRON HCL 4 MG PO TABS: 4.0000 mg | ORAL_TABLET | Freq: Three times a day (TID) | ORAL | 0 refills | Status: AC | PRN

## 2021-05-07 MED ORDER — ONDANSETRON HCL 4 MG/2ML IJ SOLN
INTRAMUSCULAR | Status: AC
  Filled 2021-05-07: qty 2

## 2021-05-07 MED ORDER — EPHEDRINE SULFATE-NACL 50-0.9 MG/10ML-% IV SOSY
PREFILLED_SYRINGE | INTRAVENOUS | Status: DC | PRN
  Administered 2021-05-07 (×5): 5 mg via INTRAVENOUS

## 2021-05-07 MED ORDER — LIDOCAINE 2% (20 MG/ML) 5 ML SYRINGE
INTRAMUSCULAR | Status: DC | PRN
  Administered 2021-05-07: 100 mg via INTRAVENOUS

## 2021-05-07 MED ORDER — MEPERIDINE HCL 25 MG/ML IJ SOLN: 6.2500 mg | INTRAMUSCULAR | Status: DC | PRN

## 2021-05-07 MED ORDER — OXYCODONE HCL 5 MG PO TABS: 5.0000 mg | ORAL_TABLET | Freq: Once | ORAL | Status: DC | PRN

## 2021-05-07 MED ORDER — ONDANSETRON HCL 4 MG/2ML IJ SOLN: 4.0000 mg | Freq: Four times a day (QID) | INTRAMUSCULAR | Status: DC | PRN

## 2021-05-07 MED ORDER — MENTHOL 3 MG MT LOZG: 1.0000 | LOZENGE | OROMUCOSAL | Status: DC | PRN

## 2021-05-07 MED ORDER — HEMOSTATIC AGENTS (NO CHARGE) OPTIME
TOPICAL | Status: DC | PRN
  Administered 2021-05-07: 1 via TOPICAL

## 2021-05-07 MED ORDER — THROMBIN 20000 UNITS EX SOLR
CUTANEOUS | Status: DC | PRN
  Administered 2021-05-07: 20000 [IU] via TOPICAL

## 2021-05-07 MED ORDER — LACTATED RINGERS IV SOLN: INTRAVENOUS | Status: DC

## 2021-05-07 MED ORDER — 0.9 % SODIUM CHLORIDE (POUR BTL) OPTIME
TOPICAL | Status: DC | PRN
  Administered 2021-05-07: 1000 mL

## 2021-05-07 MED ORDER — DEXAMETHASONE SODIUM PHOSPHATE 10 MG/ML IJ SOLN
INTRAMUSCULAR | Status: DC | PRN
  Administered 2021-05-07: 10 mg via INTRAVENOUS

## 2021-05-07 MED ORDER — LIDOCAINE 2% (20 MG/ML) 5 ML SYRINGE
INTRAMUSCULAR | Status: AC
  Filled 2021-05-07: qty 5

## 2021-05-07 MED ORDER — ACETAMINOPHEN 160 MG/5ML PO SOLN: 325.0000 mg | ORAL | Status: DC | PRN

## 2021-05-07 MED ORDER — OXYCODONE HCL 5 MG PO TABS
10.0000 mg | ORAL_TABLET | ORAL | Status: DC | PRN
  Administered 2021-05-07 – 2021-05-08 (×3): 10 mg via ORAL
  Filled 2021-05-07 (×3): qty 2

## 2021-05-07 MED ORDER — OXYCODONE-ACETAMINOPHEN 10-325 MG PO TABS: 1.0000 | ORAL_TABLET | Freq: Four times a day (QID) | ORAL | 0 refills | Status: AC | PRN

## 2021-05-07 MED ORDER — METHOCARBAMOL 500 MG PO TABS
500.0000 mg | ORAL_TABLET | Freq: Four times a day (QID) | ORAL | Status: DC | PRN
  Administered 2021-05-07 – 2021-05-08 (×2): 500 mg via ORAL
  Filled 2021-05-07 (×2): qty 1

## 2021-05-07 MED ORDER — OXYCODONE HCL 5 MG/5ML PO SOLN: 5.0000 mg | Freq: Once | ORAL | Status: DC | PRN

## 2021-05-07 MED ORDER — LOSARTAN POTASSIUM 50 MG PO TABS: 25.0000 mg | ORAL_TABLET | Freq: Every day | ORAL | Status: DC

## 2021-05-07 MED ORDER — TRANEXAMIC ACID-NACL 1000-0.7 MG/100ML-% IV SOLN
INTRAVENOUS | Status: DC | PRN
Start: 2021-05-07 — End: 2021-05-07
  Administered 2021-05-07: 1000 mg via INTRAVENOUS

## 2021-05-07 MED ORDER — PROPOFOL 10 MG/ML IV BOLUS
INTRAVENOUS | Status: AC
  Filled 2021-05-07: qty 20

## 2021-05-07 MED ORDER — ONDANSETRON HCL 4 MG/2ML IJ SOLN: 4.0000 mg | Freq: Once | INTRAMUSCULAR | Status: DC | PRN

## 2021-05-07 MED ORDER — ONDANSETRON HCL 4 MG PO TABS: 4.0000 mg | ORAL_TABLET | Freq: Four times a day (QID) | ORAL | Status: DC | PRN

## 2021-05-07 MED ORDER — ROCURONIUM BROMIDE 10 MG/ML (PF) SYRINGE
PREFILLED_SYRINGE | INTRAVENOUS | Status: DC | PRN
  Administered 2021-05-07: 70 mg via INTRAVENOUS

## 2021-05-07 MED ORDER — CHLORHEXIDINE GLUCONATE 0.12 % MT SOLN
15.0000 mL | Freq: Once | OROMUCOSAL | Status: AC
  Administered 2021-05-07: 15 mL via OROMUCOSAL
  Filled 2021-05-07: qty 15

## 2021-05-07 MED ORDER — ONDANSETRON HCL 4 MG/2ML IJ SOLN
INTRAMUSCULAR | Status: DC | PRN
Start: 2021-05-07 — End: 2021-05-07
  Administered 2021-05-07: 4 mg via INTRAVENOUS

## 2021-05-07 MED ORDER — ORAL CARE MOUTH RINSE: 15.0000 mL | Freq: Once | OROMUCOSAL | Status: AC

## 2021-05-07 MED ORDER — ACETAMINOPHEN 650 MG RE SUPP: 650.0000 mg | RECTAL | Status: DC | PRN

## 2021-05-07 MED ORDER — FLEET ENEMA 7-19 GM/118ML RE ENEM: 1.0000 | ENEMA | Freq: Once | RECTAL | Status: DC | PRN

## 2021-05-07 MED ORDER — METHOCARBAMOL 500 MG PO TABS: 500.0000 mg | ORAL_TABLET | Freq: Three times a day (TID) | ORAL | 0 refills | Status: AC | PRN

## 2021-05-07 MED ORDER — METHOCARBAMOL 1000 MG/10ML IJ SOLN: 500.0000 mg | Freq: Four times a day (QID) | INTRAVENOUS | Status: DC | PRN

## 2021-05-07 MED ORDER — PHENOL 1.4 % MT LIQD: 1.0000 | OROMUCOSAL | Status: DC | PRN

## 2021-05-07 MED ORDER — DOCUSATE SODIUM 100 MG PO CAPS
100.0000 mg | ORAL_CAPSULE | Freq: Two times a day (BID) | ORAL | Status: DC
  Administered 2021-05-07: 100 mg via ORAL
  Filled 2021-05-07: qty 1

## 2021-05-07 SURGICAL SUPPLY — 60 items
ANCHOR SWIFT LOCK ×2 IMPLANT
BAG COUNTER SPONGE SURGICOUNT (BAG) IMPLANT
CANISTER SUCT 3000ML PPV (MISCELLANEOUS) ×2 IMPLANT
CLSR STERI-STRIP ANTIMIC 1/2X4 (GAUZE/BANDAGES/DRESSINGS) ×2 IMPLANT
COVER MAYO STAND STRL (DRAPES) ×2 IMPLANT
COVER PROBE W GEL 5X96 (DRAPES) IMPLANT
COVER SURGICAL LIGHT HANDLE (MISCELLANEOUS) ×2 IMPLANT
DRAIN CHANNEL 15F RND FF W/TCR (WOUND CARE) IMPLANT
DRAPE C-ARM 42X72 X-RAY (DRAPES) ×2 IMPLANT
DRAPE SURG 17X23 STRL (DRAPES) ×2 IMPLANT
DRAPE U-SHAPE 47X51 STRL (DRAPES) ×2 IMPLANT
DRSG OPSITE POSTOP 4X6 (GAUZE/BANDAGES/DRESSINGS) ×3 IMPLANT
DURAPREP 26ML APPLICATOR (WOUND CARE) ×2 IMPLANT
ELECT BLADE 4.0 EZ CLEAN MEGAD (MISCELLANEOUS)
ELECT CAUTERY BLADE 6.4 (BLADE) IMPLANT
ELECT PENCIL ROCKER SW 15FT (MISCELLANEOUS) ×2 IMPLANT
ELECT REM PT RETURN 9FT ADLT (ELECTROSURGICAL) ×2
ELECTRODE BLDE 4.0 EZ CLN MEGD (MISCELLANEOUS) IMPLANT
ELECTRODE REM PT RTRN 9FT ADLT (ELECTROSURGICAL) ×1 IMPLANT
GENERATOR PULSE PROCLAIM 5ELIT (Neuro Prosthesis/Implant) IMPLANT
GLOVE SURG ENC MOIS LTX SZ7 (GLOVE) ×2 IMPLANT
GLOVE SURG GAMMEX LF SZ8.5 (GLOVE) ×2 IMPLANT
GLOVE SURG UNDER POLY LF SZ7 (GLOVE) ×2 IMPLANT
GLOVE SURG UNDER POLY LF SZ8.5 (GLOVE) ×2 IMPLANT
GOWN STRL REUS W/ TWL LRG LVL3 (GOWN DISPOSABLE) ×2 IMPLANT
GOWN STRL REUS W/TWL 2XL LVL3 (GOWN DISPOSABLE) ×2 IMPLANT
GOWN STRL REUS W/TWL LRG LVL3 (GOWN DISPOSABLE) ×4
KIT BASIN OR (CUSTOM PROCEDURE TRAY) ×2 IMPLANT
KIT TURNOVER KIT B (KITS) ×2 IMPLANT
LEAD LAMITRODE 60CM 8CH (Lead) ×1 IMPLANT
LEAD OCTRODE GEN 8CH 60CM (Lead) ×2 IMPLANT
NDL SPNL 18GX3.5 QUINCKE PK (NEEDLE) ×1 IMPLANT
NEEDLE 22X1 1/2 (OR ONLY) (NEEDLE) ×2 IMPLANT
NEEDLE SPNL 18GX3.5 QUINCKE PK (NEEDLE) ×2 IMPLANT
NS IRRIG 1000ML POUR BTL (IV SOLUTION) ×2 IMPLANT
PACK LAMINECTOMY ORTHO (CUSTOM PROCEDURE TRAY) ×2 IMPLANT
PACK UNIVERSAL I (CUSTOM PROCEDURE TRAY) ×2 IMPLANT
PAD ARMBOARD 7.5X6 YLW CONV (MISCELLANEOUS) ×6 IMPLANT
PROGRAMMER DBS W/MAGNET NMRI (MISCELLANEOUS) ×1 IMPLANT
PULSE GENERATOR PROCLAIM 5ELIT (Neuro Prosthesis/Implant) ×2 IMPLANT
SPATULA SILICONE BRAIN 10MM (MISCELLANEOUS) IMPLANT
SPONGE SURGIFOAM ABS GEL 100 (HEMOSTASIS) ×2 IMPLANT
SPONGE T-LAP 4X18 ~~LOC~~+RFID (SPONGE) IMPLANT
STAPLER VISISTAT 35W (STAPLE) IMPLANT
STRIP CLOSURE SKIN 1/2X4 (GAUZE/BANDAGES/DRESSINGS) ×1 IMPLANT
SURGIFLO W/THROMBIN 8M KIT (HEMOSTASIS) ×2 IMPLANT
SUT BONE WAX W31G (SUTURE) ×2 IMPLANT
SUT ETHIBOND 2 OS 4 DA (SUTURE) ×2 IMPLANT
SUT ETHIBOND 4 0 TF (SUTURE) IMPLANT
SUT MNCRL AB 3-0 PS2 18 (SUTURE) ×4 IMPLANT
SUT VIC AB 1 CT1 18XCR BRD 8 (SUTURE) ×2 IMPLANT
SUT VIC AB 1 CT1 8-18 (SUTURE) ×6
SUT VIC AB 2-0 CT1 18 (SUTURE) ×2 IMPLANT
SYR BULB IRRIG 60ML STRL (SYRINGE) ×2 IMPLANT
SYR CONTROL 10ML LL (SYRINGE) ×2 IMPLANT
TOOL TUNNELING 20 (MISCELLANEOUS) ×1 IMPLANT
TOWEL GREEN STERILE (TOWEL DISPOSABLE) ×2 IMPLANT
TOWEL GREEN STERILE FF (TOWEL DISPOSABLE) ×2 IMPLANT
WATER STERILE IRR 1000ML POUR (IV SOLUTION) ×2 IMPLANT
YANKAUER SUCT BULB TIP NO VENT (SUCTIONS) ×2 IMPLANT

## 2021-05-07 NOTE — H&P (Signed)
Addendum H&P: Patient continues to have severe debilitating plan.  We are planning on moving forward with the permanent spinal cord stimulator implantation.  All appropriate risks, benefits, and alternatives to surgery were discussed with the patient and consent was obtained.  There has been no change in his clinical exam since his last office note of 05/06/2017.

## 2021-05-07 NOTE — Anesthesia Preprocedure Evaluation (Addendum)
Anesthesia Evaluation  Patient identified by MRN, date of birth, ID band Patient awake    Reviewed: Allergy & Precautions, NPO status , Patient's Chart, lab work & pertinent test results  Airway Mallampati: I       Dental  (+) Poor Dentition, Edentulous Upper, Edentulous Lower   Pulmonary former smoker,    Pulmonary exam normal        Cardiovascular hypertension, Pt. on medications Normal cardiovascular exam Rate:Normal     Neuro/Psych negative neurological ROS  negative psych ROS   GI/Hepatic negative GI ROS, Neg liver ROS,   Endo/Other  Hypothyroidism   Renal/GU negative Renal ROS  negative genitourinary   Musculoskeletal negative musculoskeletal ROS (+) Arthritis , Osteoarthritis,    Abdominal (+) + obese,   Peds negative pediatric ROS (+)  Hematology negative hematology ROS (+)   Anesthesia Other Findings   Reproductive/Obstetrics negative OB ROS                            Anesthesia Physical  Anesthesia Plan  ASA: II  Anesthesia Plan: General   Post-op Pain Management:    Induction: Intravenous  PONV Risk Score and Plan: 2 and Ondansetron, Dexamethasone and Treatment may vary due to age or medical condition  Airway Management Planned: Oral ETT  Additional Equipment: None  Intra-op Plan:   Post-operative Plan: Extubation in OR  Informed Consent: I have reviewed the patients History and Physical, chart, labs and discussed the procedure including the risks, benefits and alternatives for the proposed anesthesia with the patient or authorized representative who has indicated his/her understanding and acceptance.     Dental advisory given  Plan Discussed with: CRNA  Anesthesia Plan Comments:         Anesthesia Quick Evaluation

## 2021-05-07 NOTE — Brief Op Note (Signed)
05/07/2021  12:15 PM  PATIENT:  [name] [name]  71 y.o. male  PRE-OPERATIVE DIAGNOSIS:  Chronic lumbar back pain, degenerative disease  POST-OPERATIVE DIAGNOSIS:  Chronic lumbar back pain, degenerative disease  PROCEDURE:  Procedure(s) with comments: SPINAL CORD STIMULATOR INSERTION (N/A) - 2.5 hrs 3 C-Bed  SURGEON:  Surgeon(s) and Role:    Melina Schools, MD - Primary  PHYSICIAN ASSISTANT:   ASSISTANTS: Nelson Chimes, PA   ANESTHESIA:   general  EBL:  10 mL   BLOOD ADMINISTERED:none  DRAINS: none   LOCAL MEDICATIONS USED:  MARCAINE     SPECIMEN:  No Specimen  DISPOSITION OF SPECIMEN:  N/A  COUNTS:  YES  TOURNIQUET:  * No tourniquets in log *  DICTATION: .Dragon Dictation  PLAN OF CARE: Admit for overnight observation  PATIENT DISPOSITION:  PACU - hemodynamically stable.

## 2021-05-07 NOTE — Discharge Instructions (Signed)

## 2021-05-07 NOTE — Op Note (Signed)
OPERATIVE REPORT  DATE OF SURGERY: 05/07/2021  PATIENT NAME:  [name] [name] MRN: [id number] DOB: 04-09-1951  PCP: Heywood Bene, PA-C  PRE-OPERATIVE DIAGNOSIS: Chronic pain syndrome.  Status post successful spinal cord stimulator trial.  POST-OPERATIVE DIAGNOSIS: Same  PROCEDURE:   Implantation of spinal cord stimulator  SURGEON:  Melina Schools, MD  PHYSICIAN ASSISTANT: Nelson Chimes, PA  ANESTHESIA:   General  EBL: 10 ml   Complications: None  Implants: Abbott spinal cord stimulator.  Octrode lead x2.  Proclaim XR battery  BRIEF HISTORY: [name] [name] is a 71 y.o. male who has had longstanding severe back buttock and neuropathic leg pain.  Attempts at conservative management had failed to alleviate his pain and improve his quality of life.  Eventually had a spinal cord stimulator trial and noted significant improvement in his pain.  As result we elected to move forward with permanent implantation.  All appropriate risks, benefits, and alternatives were discussed with the patient and consent was obtained.  PROCEDURE DETAILS: Patient was brought into the operating room and was properly positioned on the operating room table.  After induction with general anesthesia the patient was endotracheally intubated.  A timeout was taken to confirm all important data: including patient, procedure, and the level. Teds, SCD's were applied.   The thoracolumbar spine was prepped and draped in a standard fashion.  Using fluoroscopy identified the T10 pedicle and marked out incision in the midline starting at this level moving inferiorly to approximately the T12 level I infiltrated the skin with quarter percent Marcaine and made a midline incision.  Sharp dissection was carried out down to the deep fascia.  The deep fascia was sharply incised and I stripped the paraspinal muscles to expose the spinous processes and lamina of T10, and T11 and a portion of that of T12.  Hemostasis was obtained  using bipolar electrocautery and a self-retaining retractor was placed.  I then placed took another x-ray confirmed that I was at the T10-11 intervertebral space.  I then used a double-action Leksell rongeur to remove the interspinous process ligament and the inferior third of the spinous process of T10.  I then gently dissected under the lamina and used a 2 mm Kerrison rongeur to perform a laminotomy of T10.  I then gently dissected through the ligamentum flavum my Penfield 4 and then resected this with a Kerrison rongeur.  I can now see the dorsal aspect of the thecal sac.  The octrode lead was then obtained and gently passed underneath the T10 laminotomy site up until approximately the inferior portion of T8.  This lead was essentially midline.  A second lead was then passed just to the left side of this because he was having increased left neuropathic leg pain.  This lead came to rest at about the superior portion of T9.  Imaging studies confirmed satisfactory position of both leads in the AP and lateral planes.  I confirmed with the representative that I was positioned in a way to cover the same area that was covered during the trial.  When we confirm this I then secured the leads directly to the spinous process of T11.  Using #1 Ethibond suture and the locking anchors I secured them directly to the bone.  I then made a small incision at the T11-12 interspinous ligament in order to loop the leads around the T11 spinous process.  Once the leads were properly placed I then turned my attention to the battery site.  A second  incision site was marked out prior to surgery and was infiltrated with quarter percent Marcaine with epinephrine small incision was made to fit the battery and I dissected down approximately 2-1/2 cm and then created a pocket.  Using the submuscular passer I passed the wires from the thoracic wound to the lower left luteal region.  At this point I irrigated the wound copiously normal saline and  made sure hemostasis using bipolar electrocautery, TXA, and Floseal.  Once I confirmed hemostasis I then disconnected the Bovie and bipolar to prevent inadvertent damage to the battery.  The battery was then obtained and the leads were secured to the battery and they were tightened according manufacture standards.  The battery was then placed in the pocket and the excess leads were coiled and placed on the undersurface of I then secured the battery to the deep fascia with two #1 Vicryl sutures.  At this point the rep confirmed that the device was working appropriately.  Imaging studies demonstrated satisfactory position of the leads in both the AP and lateral planes.  Both wounds were copiously irrigated with normal saline.  I then closed the deep fascia of both wounds with interrupted #1 Vicryl suture, then a layer of 2-0 Vicryl suture, and finally 3-0 Monocryl for the skin.  Steri-Strips and dry dressings were applied the patient was ultimately extubated transfer the PACU without incident.  End of the case all needle sponge counts were correct.  There were no adverse intraoperative events.  Melina Schools, MD 05/07/2021 12:06 PM

## 2021-05-07 NOTE — Anesthesia Procedure Notes (Signed)
Procedure Name: Intubation Date/Time: 05/07/2021 10:06 AM Performed by: Michele Rockers, CRNA Pre-anesthesia Checklist: Patient identified, Patient being monitored, Timeout performed, Emergency Drugs available and Suction available Patient Re-evaluated:Patient Re-evaluated prior to induction Oxygen Delivery Method: Circle system utilized Preoxygenation: Pre-oxygenation with 100% oxygen Induction Type: IV induction Ventilation: Mask ventilation without difficulty Laryngoscope Size: Miller and 2 Grade View: Grade I Tube type: Oral Tube size: 7.5 mm Number of attempts: 1 Airway Equipment and Method: Stylet Placement Confirmation: ETT inserted through vocal cords under direct vision, positive ETCO2 and breath sounds checked- equal and bilateral Secured at: 23 cm Tube secured with: Tape Dental Injury: Teeth and Oropharynx as per pre-operative assessment

## 2021-05-07 NOTE — Progress Notes (Incomplete)
Hearing aides placed by patient

## 2021-05-07 NOTE — Transfer of Care (Signed)
Immediate Anesthesia Transfer of Care Note  Patient: [name] [name]  Procedure(s) Performed: SPINAL CORD STIMULATOR INSERTION (Back)  Patient Location: PACU  Anesthesia Type:General  Level of Consciousness: awake, alert , patient cooperative and responds to stimulation  Airway & Oxygen Therapy: Patient Spontanous Breathing and Patient connected to face mask oxygen  Post-op Assessment: Report given to RN, Post -op Vital signs reviewed and stable and Patient moving all extremities X 4  Post vital signs: Reviewed and stable  Last Vitals:  Vitals Value Taken Time  BP    Temp    Pulse 86 05/07/21 1249  Resp 17 05/07/21 1249  SpO2 98 % 05/07/21 1249  Vitals shown include unvalidated device data.  Last Pain:  Vitals:   05/07/21 0800  PainSc: 3       Patients Stated Pain Goal: 3 (00/17/49 4496)  Complications: No notable events documented.

## 2021-05-08 ENCOUNTER — Encounter (HOSPITAL_COMMUNITY): Payer: Self-pay | Admitting: Orthopedic Surgery

## 2021-05-08 DIAGNOSIS — G8929 Other chronic pain: Secondary | ICD-10-CM | POA: Diagnosis not present

## 2021-05-08 NOTE — Progress Notes (Signed)
Subjective: 1 Day Post-Op Procedure(s) (LRB): SPINAL CORD STIMULATOR INSERTION (N/A) Patient reports pain as mild.   Tolerating PO without N/V +void +flatus No CP, SOB, calf pain  Objective: Vital signs in last 24 hours: Temp:  [97.6 F (36.4 C)-98.4 F (36.9 C)] 97.6 F (36.4 C) (01/20 0726) Pulse Rate:  [54-86] 65 (01/20 0726) Resp:  [16-20] 18 (01/20 0726) BP: (114-174)/(66-94) 124/70 (01/20 0726) SpO2:  [92 %-98 %] 95 % (01/20 0726) Weight:  [99.8 kg] 99.8 kg (01/19 0800)  Intake/Output from previous day: 01/19 0701 - 01/20 0700 In: 1440 [P.O.:240; I.V.:1100; IV Piggyback:100] Out: 10 [Blood:10] Intake/Output this shift: No intake/output data recorded.  Recent Labs    05/05/21 0926  HGB 14.7   Recent Labs    05/05/21 0926  WBC 8.1  RBC 5.01  HCT 43.7  PLT 241   Recent Labs    05/05/21 0926  NA 139  K 4.1  CL 105  CO2 26  BUN 13  CREATININE 1.02  GLUCOSE 150*  CALCIUM 9.8   No results for input(s): LABPT, INR in the last 72 hours.  Neurologically intact ABD soft Neurovascular intact Sensation intact distally Intact pulses distally Dorsiflexion/Plantar flexion intact Incision: scant drainage No cellulitis present Compartment soft   Assessment/Plan: 1 Day Post-Op Procedure(s) (LRB): SPINAL CORD STIMULATOR INSERTION (N/A) Advance diet Up with therapy IS encouraged. DVT Ppx: Teds. SCDs, ambulation  Plan D/C home today.  F/u in 2 weeks    Charlyne Petrin 05/08/2021, 7:43 AM

## 2021-05-08 NOTE — Progress Notes (Signed)
Patient alert and oriented, mae's well, voiding adequate amount of urine, swallowing without difficulty, no c/o pain at time of discharge. Patient discharged home with family. Script and discharged instructions given to patient. Patient and family stated understanding of instructions given. Patient has an appointment with Dr. Rolena Infante. Patient waiting for ride home

## 2021-05-08 NOTE — Discharge Summary (Signed)
Patient ID: [name] [name] MRN: [id number] DOB/AGE: 1950-06-20 71 y.o.  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   left temple-txpbx   CIS (carcinoma in situ) 04/28/2016   left crown of scalp-txpbx   Erectile dysfunction    Heart murmur    History of colonic polyps    History of kidney stones    Hypertension 10/1925   Hypothyroidism    Internal hemorrhoids    Osteoarthritis    Pre-diabetes    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   right neck-CX35FU+imiquimod   SCC (squamous cell carcinoma) 03/20/2010   Left jawline-CX35FU   SCC (squamous cell carcinoma) 11/28/2012   right cheek-Cx35FU, cautery and 5FU   SCC (squamous cell carcinoma) 01/20/2016   left inner ear-Cx86fu   SCC (squamous cell carcinoma) 01/20/2016   left cheek CX30fu   SCC (squamous cell carcinoma) 02/15/2018   left forehead-txpbx   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 all CX35FU   Seasonal allergies     Surgeries: Procedure(s): SPINAL CORD STIMULATOR INSERTION on 05/07/2021   Consultants:   Discharged Condition: Improved  Hospital Course: IANN RODIER is an 71 y.o. male who was admitted 05/07/2021 for operative treatment of Chronic pain. Patient failed conservative treatments (please see the history and physical for the specifics) and had severe unremitting pain that affects sleep, daily activities and work/hobbies. After pre-op clearance, the patient was taken to the operating room on 05/07/2021  and underwent  Procedure(s): [place].    Patient was given perioperative antibiotics:  Anti-infectives (From admission, onward)    Start     Dose/Rate Route Frequency Ordered Stop   05/07/21 1600  ceFAZolin (ANCEF) IVPB 1 g/50 mL premix        1 g 100 mL/hr over 30 Minutes Intravenous Every 8 hours 05/07/21 1402 05/07/21 2354   05/07/21 0747  ceFAZolin (ANCEF) IVPB 2g/100 mL premix        2 g 200 mL/hr over 30 Minutes Intravenous 30 min pre-op 05/07/21 0747 05/07/21 1025        Patient was given sequential compression devices and early ambulation to prevent DVT.   Patient benefited maximally from hospital stay and there were no complications. At the time of discharge, the patient was urinating/moving their bowels without difficulty, tolerating a regular diet, pain is controlled with oral pain medications and they have been cleared by PT/OT.   Recent vital signs: Patient Vitals for the past 24 hrs:  BP Temp Temp src Pulse Resp SpO2  05/08/21 0726 124/70 97.6 F (36.4 C) Oral 65 18 95 %  05/08/21 0400 119/66 97.9 F (36.6 C) Oral (!) 54 18 95 %  05/07/21 2346 114/67 97.8 F (36.6 C) Oral 72 18 92 %  05/07/21 2100 121/67 98.4 F (36.9 C) Oral 83 20 94 %  05/07/21 1735 (!) 146/74 98 F (36.7 C) Oral 83 18 96 %  05/07/21 1420 (!) 174/94 97.8 F (36.6 C) Oral 78 19 92 %  05/07/21 1400 (!) 145/78 97.8 F (36.6 C) -- 71 18 97 %  Recent laboratory studies: No results for input(s): WBC, HGB, HCT, PLT, NA, K, CL, CO2, BUN, CREATININE, GLUCOSE, INR, CALCIUM in the last 72 hours.  Invalid input(s): PT, 2   Discharge Medications:   Allergies as of 05/08/2021   No Known Allergies      Medication List     STOP taking these medications    tiZANidine 2 MG tablet Commonly known as: ZANAFLEX       TAKE these medications    aspirin EC 81 MG tablet Take 81 mg by mouth daily.   atorvastatin 20 MG tablet Commonly known as: LIPITOR Take 20 mg by  mouth daily.   cetirizine 10 MG tablet Commonly known as: ZYRTEC Take 10 mg by mouth daily.   Dialyvite Vitamin D 5000 125 MCG (5000 UT) capsule Generic drug: Cholecalciferol Take 5,000 Units by mouth daily.   ESTER C PO Take 1 tablet by mouth daily.   fluticasone 50 MCG/ACT nasal spray Commonly known as: FLONASE Place 1-2 sprays into both nostrils daily as needed for allergies or rhinitis.   gabapentin 100 MG capsule Commonly known as: NEURONTIN Take 100 mg by mouth every 8 (eight) hours as needed (pain).   levothyroxine 88 MCG tablet Commonly known as: SYNTHROID Take 88 mcg by mouth daily before breakfast.   losartan 25 MG tablet Commonly known as: COZAAR Take 25 mg by mouth daily.   methocarbamol 500 MG tablet Commonly known as: Robaxin Take 1 tablet (500 mg total) by mouth every 8 (eight) hours as needed for up to 5 days for muscle spasms.   ondansetron 4 MG tablet Commonly known as: Zofran Take 1 tablet (4 mg total) by mouth every 8 (eight) hours as needed for nausea or vomiting.   oxyCODONE-acetaminophen 10-325 MG tablet Commonly known as: Percocet Take 1 tablet by mouth every 6 (six) hours as needed for up to 5 days for pain.   REFRESH OP Place 1 drop into both eyes 2 (two) times daily as needed (dry eyes).   [place] 4 % Crea Generic drug: Fluorouracil Apply 1 application topically at bedtime.   VITAMIN B COMPLEX PO Take 1 tablet by mouth daily.        Diagnostic Studies: DG THORACOLUMABAR SPINE  Result Date: 05/07/2021 CLINICAL DATA:  Fluoroscopic assistance for placement of neurostimulator leads in the thoracic spinal canal EXAM: THORACOLUMBAR SPINE 1V COMPARISON:  None. FINDINGS: Fluoroscopic images show neurostimulator leads in the thoracic spinal canal. Fluoroscopic time was 63 seconds. Radiation dose is 20.55 mGy. IMPRESSION: Fluoroscopic assistance was provided for placement of neurostimulator leads in the thoracic spinal canal. Electronically  Signed   By: Elmer Picker M.D.   On: 05/07/2021 13:28   DG C-Arm 1-60 Min-No Report  Result Date: 05/07/2021 Fluoroscopy was utilized by the requesting physician.  No radiographic interpretation.   DG C-Arm 1-60 Min-No Report  Result Date: 05/07/2021 Fluoroscopy was utilized by the requesting physician.  No radiographic interpretation.    Discharge Instructions     Incentive spirometry RT   Complete by: As directed         Follow-up Information     Melina Schools, MD. Schedule an appointment as soon as possible for a visit in 2 week(s).   Specialty: Orthopedic Surgery Why: If symptoms worsen, For suture removal, For wound re-check Contact information: [address] [place]  [address] [phone]                 Discharge Plan:  discharge to home  Disposition: stable    Signed: Charlyne Petrin for [facility] PA-C [facility] [phone] 05/08/2021, 1:52 PM

## 2021-05-08 NOTE — Anesthesia Postprocedure Evaluation (Signed)
Anesthesia Post Note  Patient: [name] [name]  Procedure(s) Performed: SPINAL CORD STIMULATOR INSERTION (Back)     Patient location during evaluation: PACU Anesthesia Type: General Level of consciousness: awake Pain management: pain level controlled Vital Signs Assessment: post-procedure vital signs reviewed and stable Respiratory status: spontaneous breathing Cardiovascular status: stable Postop Assessment: no apparent nausea or vomiting Anesthetic complications: no   No notable events documented.  Last Vitals:  Vitals:   05/08/21 0400 05/08/21 0726  BP: 119/66 124/70  Pulse: (!) 54 65  Resp: 18 18  Temp: 36.6 C 36.4 C  SpO2: 95% 95%    Last Pain:  Vitals:   05/08/21 0726  TempSrc: Oral  PainSc:                  Huston Foley

## 2021-05-08 NOTE — Evaluation (Signed)
Physical Therapy Evaluation/ discharge Patient Details Name: [name] [name] MRN: [id number] DOB: [birth year] Today's Date: 05/08/2021  History of Present Illness  71 yo admitted 1/19 for spinal cord stimulator implant. PMhx: HTn, hypothyroidism, squamous cell CA  Clinical Impression  Pt very pleasant, lives with wife and enjoys gardening. Pt educated for back precautions for safety with pt stating desire to return to gardening and golf. Pt able to perform all basic transfers, gait and stairs without physical assist and all education completed with pt verbalizing understanding. No further acute needs with pt aware and agreeable.        Recommendations for follow up therapy are one component of a multi-disciplinary discharge planning process, led by the attending physician.  Recommendations may be updated based on patient status, additional functional criteria and insurance authorization.  Follow Up Recommendations No PT follow up    Assistance Recommended at Discharge PRN  Patient can return home with the following  Assistance with cooking/housework    Equipment Recommendations None recommended by PT  Recommendations for Other Services       Functional Status Assessment Patient has not had a recent decline in their functional status     Precautions / Restrictions Precautions Precautions: None      Mobility  Bed Mobility Overal bed mobility: Modified Independent             General bed mobility comments: pt educated for rolling to side and rise with pt able to complete without assist    Transfers Overall transfer level: Modified independent                      Ambulation/Gait Ambulation/Gait assistance: Modified independent (Device/Increase time) Gait Distance (Feet): 400 Feet Assistive device: None Gait Pattern/deviations: Step-through pattern, Decreased stride length   Gait velocity interpretation: >2.62 ft/sec, indicative of community ambulatory    General Gait Details: pt with slow, steady and controlled gait  Stairs Stairs: Yes Stairs assistance: Modified independent (Device/Increase time) Stair Management: Two rails, Alternating pattern, Forwards Number of Stairs: 4    Wheelchair Mobility    Modified Rankin (Stroke Patients Only)       Balance Overall balance assessment: No apparent balance deficits (not formally assessed)                                           Pertinent Vitals/Pain Pain Assessment Pain Assessment: 0-10 Pain Score: 2  Pain Descriptors / Indicators: Aching Pain Intervention(s): Limited activity within patient's tolerance, Monitored during session, Repositioned    Home Living Family/patient expects to be discharged to:: Private residence Living Arrangements: Spouse/significant other Available Help at Discharge: Family;Available 24 hours/day Type of Home: House Home Access: Stairs to enter Entrance Stairs-Rails: Psychiatric nurse of Steps: 4 Alternate Level Stairs-Number of Steps: 12 Home Layout: Two level Home Equipment: Cane - quad;Crutches      Prior Function Prior Level of Function : Independent/Modified Independent                     Hand Dominance        Extremity/Trunk Assessment   Upper Extremity Assessment Upper Extremity Assessment: Overall WFL for tasks assessed    Lower Extremity Assessment Lower Extremity Assessment: Overall WFL for tasks assessed    Cervical / Trunk Assessment Cervical / Trunk Assessment: Normal;Back Surgery  Communication   Communication: No difficulties  Cognition Arousal/Alertness: Awake/alert Behavior During Therapy: WFL for tasks assessed/performed Overall Cognitive Status: Within Functional Limits for tasks assessed                                          General Comments      Exercises     Assessment/Plan    PT Assessment Patient does not need any further PT services   PT Problem List         PT Treatment Interventions      PT Goals (Current goals can be found in the Care Plan section)  Acute Rehab PT Goals PT Goal Formulation: All assessment and education complete, DC therapy    Frequency       Co-evaluation               AM-PAC PT "6 Clicks" Mobility  Outcome Measure Help needed turning from your back to your side while in a flat bed without using bedrails?: None Help needed moving from lying on your back to sitting on the side of a flat bed without using bedrails?: None Help needed moving to and from a bed to a chair (including a wheelchair)?: None Help needed standing up from a chair using your arms (e.g., wheelchair or bedside chair)?: None Help needed to walk in hospital room?: None Help needed climbing 3-5 steps with a railing? : None 6 Click Score: 24    End of Session   Activity Tolerance: Patient tolerated treatment well Patient left: in chair;with call bell/phone within reach Nurse Communication: Mobility status PT Visit Diagnosis: Other abnormalities of gait and mobility (R26.89)    Time: 0355-9741 PT Time Calculation (min) (ACUTE ONLY): 15 min   Charges:   PT Evaluation $PT Eval Low Complexity: 1 Low          Nashiya Disbrow P, PT Acute Rehabilitation Services Pager: [phone] Office: [place] 05/08/2021, 9:11 AM

## 2021-06-04 ENCOUNTER — Encounter: Payer: Self-pay | Admitting: Physician Assistant

## 2021-06-04 ENCOUNTER — Other Ambulatory Visit: Payer: Self-pay

## 2021-06-04 ENCOUNTER — Ambulatory Visit (INDEPENDENT_AMBULATORY_CARE_PROVIDER_SITE_OTHER): Payer: PPO | Admitting: Physician Assistant

## 2021-06-04 DIAGNOSIS — C4441 Basal cell carcinoma of skin of scalp and neck: Secondary | ICD-10-CM | POA: Diagnosis not present

## 2021-06-04 MED ORDER — MUPIROCIN 2 % EX OINT: 1.0000 "application " | TOPICAL_OINTMENT | Freq: Two times a day (BID) | CUTANEOUS | 3 refills | Status: AC

## 2021-06-09 ENCOUNTER — Other Ambulatory Visit: Payer: Self-pay | Admitting: Ophthalmology

## 2021-06-10 ENCOUNTER — Telehealth: Payer: Self-pay | Admitting: *Deleted

## 2021-06-10 NOTE — Telephone Encounter (Signed)
-----   Message from [name], MD sent at 06/10/2021 10:15 AM EST ----- Margins [name], 6 month follow up

## 2021-06-10 NOTE — Telephone Encounter (Signed)
Path to patient. Patient has another appointment scheduled to have another lesion treated. He will schedule his 6 month follow up at that time.

## 2021-06-25 ENCOUNTER — Other Ambulatory Visit: Payer: Self-pay

## 2021-06-25 ENCOUNTER — Ambulatory Visit (INDEPENDENT_AMBULATORY_CARE_PROVIDER_SITE_OTHER): Payer: PPO | Admitting: Physician Assistant

## 2021-06-25 ENCOUNTER — Encounter: Payer: Self-pay | Admitting: Physician Assistant

## 2021-06-25 DIAGNOSIS — Z85828 Personal history of other malignant neoplasm of skin: Secondary | ICD-10-CM

## 2021-06-25 DIAGNOSIS — C44519 Basal cell carcinoma of skin of other part of trunk: Secondary | ICD-10-CM | POA: Diagnosis not present

## 2021-06-25 NOTE — Patient Instructions (Signed)

## 2021-06-25 NOTE — Progress Notes (Deleted)
? ?  Follow-Up Visit ?  ?Subjective  ?[name] [name] is a 71 y.o. male who presents for the following: Procedure (Suture removal and bcc on the back). ? ? ?The following portions of the chart were reviewed this encounter and updated as appropriate:  Tobacco  Allergies  Meds  Problems  Med Hx  Surg Hx  Fam Hx   ?  ? ?Objective  ?Well appearing patient in no apparent distress; mood and affect are within normal limits. ? ?All skin waist up examined. ? ?Mid Back ?Pink macule ? ?Neck - Posterior ?Here for suture removal x 1- no sign or symptoms of infection- pathology to patient. Wound is approximated and healed well.  ? ? ?Assessment & Plan  ?Basal cell carcinoma (BCC) of skin of other part of torso ?Mid Back ? ?Destruction of lesion ?Complexity: simple   ?Destruction method: electrodesiccation and curettage   ?Informed consent: discussed and consent obtained   ?Timeout:  patient name, date of birth, surgical site, and procedure verified ?Anesthesia: the lesion was anesthetized in a standard fashion   ?Anesthetic:  1% lidocaine w/ epinephrine 1-100,000 local infiltration ?Curettage performed in three different directions: Yes   ?Electrodesiccation performed over the curetted area: Yes   ?Curettage cycles:  3 ?Final wound size (cm):  1.9 ?Hemostasis achieved with:  aluminum chloride ?Outcome: patient tolerated procedure well with no complications   ?Post-procedure details: wound care instructions given   ?Additional details:  Wound inoculated with 5% fluorouracil solution ? ?H/O basal cell carcinoma excision ?Neck - Posterior ? ?Skin examination in 6 months ? ? ? ?I, Asmara Backs, PA-C, have reviewed all documentation's for this visit.  The documentation on 06/25/21 for the exam, diagnosis, procedures and orders are all accurate and complete. ?

## 2021-06-25 NOTE — Progress Notes (Signed)
? ?  Follow-Up Visit ?  ?Subjective  ?[name] [name] is a 71 y.o. male who presents for the following: Procedure (Suture removal and bcc on the back). ? ? ?The following portions of the chart were reviewed this encounter and updated as appropriate:  Tobacco  Allergies  Meds  Problems  Med Hx  Surg Hx  Fam Hx   ?  ? ?Objective  ?Well appearing patient in no apparent distress; mood and affect are within normal limits. ? ?A full examination was performed including scalp, head, eyes, ears, nose, lips, neck, chest, axillae, abdomen, back, buttocks, bilateral upper extremities, bilateral lower extremities, hands, feet, fingers, toes, fingernails, and toenails. All findings within normal limits unless otherwise noted below. ? ?Mid Back ?Pink macule ? ?Neck - Posterior ?Here for suture removal x 1- no signs or symptoms of infection- pathology to patient. Wound is approximated and healed well.  ? ? ?Assessment & Plan  ?Basal cell carcinoma (BCC) of skin of other part of torso ?Mid Back ? ?Destruction of lesion ?Complexity: simple   ?Destruction method: electrodesiccation and curettage   ?Informed consent: discussed and consent obtained   ?Timeout:  patient name, date of birth, surgical site, and procedure verified ?Anesthesia: the lesion was anesthetized in a standard fashion   ?Anesthetic:  1% lidocaine w/ epinephrine 1-100,000 local infiltration ?Curettage performed in three different directions: Yes   ?Electrodesiccation performed over the curetted area: Yes   ?Curettage cycles:  3 ?Final wound size (cm):  1.9 ?Hemostasis achieved with:  aluminum chloride ?Outcome: patient tolerated procedure well with no complications   ?Post-procedure details: wound care instructions given   ?Additional details:  Wound inoculated with 5% fluorouracil solution ? ?H/O basal cell carcinoma excision ?Neck - Posterior ? ?Skin examination in 6 months ? ? ? ?I, Justyce Baby, PA-C, have reviewed all documentation's for this visit.  The  documentation on 06/25/21 for the exam, diagnosis, procedures and orders are all accurate and complete. ?

## 2021-06-26 ENCOUNTER — Encounter: Payer: Self-pay | Admitting: Physician Assistant

## 2021-06-26 NOTE — Progress Notes (Signed)
° °  Follow-Up Visit   Subjective  [name] [name] is a 71 y.o. male who presents for the following: Procedure (Neck- posterior bcc x 1 ).   The following portions of the chart were reviewed this encounter and updated as appropriate:  Tobacco   Allergies   Meds   Problems   Med Hx   Surg Hx   Fam Hx       Objective  Well appearing patient in no apparent distress; mood and affect are within normal limits.  A focused examination was performed including back. Relevant physical exam findings are noted in the Assessment and Plan.  Neck - Posterior Thick pink plaque with central erosion.   Assessment & Plan  Basal cell carcinoma (BCC) of skin of neck Neck - Posterior  Skin excision  Total excision diameter (cm):  4.5 Informed consent: discussed and consent obtained   Timeout: patient name, date of birth, surgical site, and procedure verified   Anesthesia: the lesion was anesthetized in a standard fashion   Anesthetic:  1% lidocaine w/ epinephrine 1-100,000 local infiltration Instrument used: #15 blade   Hemostasis achieved with: pressure and electrodesiccation   Outcome: patient tolerated procedure well with no complications   Post-procedure details: sterile dressing applied and wound care instructions given   Dressing type: bandage, petrolatum and pressure dressing    Skin repair Complexity:  Simple Final length (cm):  4.5 Informed consent: discussed and consent obtained   Timeout: patient name, date of birth, surgical site, and procedure verified   Procedure prep:  Patient was prepped and draped in usual sterile fashion (non sterile) Prep type:  Chlorhexidine Anesthesia: the lesion was anesthetized in a standard fashion   Undermining: edges undermined   Fine/surface layer approximation (top stitches):  Suture size:  3-0 Suture type: Vicryl Rapide (coated polyglactin 910)   Suture type comment:  Nylon Stitches: simple running   Suture removal (days):  14 Hemostasis achieved  with: suture, pressure and electrodesiccation Outcome: patient tolerated procedure well with no complications   Post-procedure details: wound care instructions given   Post-procedure details comment:  Non sterile pressure  Additional details:  3-0 vicryl x 5 3- Ethilon x 1   mupirocin ointment (BACTROBAN) 2 % Apply 1 application topically 2 (two) times daily.  Specimen 1 - Surgical pathology Differential Diagnosis: bcc [id number] Right Margin stained  Check Margins: yes    I, Orian Figueira, PA-C, have reviewed all documentation's for this visit.  The documentation on 06/26/21 for the exam, diagnosis, procedures and orders are all accurate and complete.

## 2021-10-07 ENCOUNTER — Other Ambulatory Visit: Payer: Self-pay | Admitting: Ophthalmology

## 2021-12-17 ENCOUNTER — Ambulatory Visit: Payer: PPO | Admitting: Physician Assistant

## 2022-01-25 ENCOUNTER — Other Ambulatory Visit: Payer: Self-pay | Admitting: Ophthalmology

## 2022-05-19 ENCOUNTER — Other Ambulatory Visit: Payer: Self-pay | Admitting: Ophthalmology

## 2022-12-24 ENCOUNTER — Other Ambulatory Visit: Payer: Self-pay | Admitting: Ophthalmology

## 2023-08-24 ENCOUNTER — Other Ambulatory Visit: Payer: Self-pay | Admitting: Ophthalmology

## 2023-08-25 LAB — SURGICAL PATHOLOGY
